# Patient Record
Sex: Female | Born: 1965
Health system: Southern US, Community
[De-identification: ages and names within clinical notes are randomized; demographics above are authoritative.]

## PROBLEM LIST (undated history)

## (undated) DIAGNOSIS — K219 Gastro-esophageal reflux disease without esophagitis: Secondary | ICD-10-CM

## (undated) DIAGNOSIS — C4491 Basal cell carcinoma of skin, unspecified: Secondary | ICD-10-CM

## (undated) DIAGNOSIS — E785 Hyperlipidemia, unspecified: Secondary | ICD-10-CM

## (undated) DIAGNOSIS — K449 Diaphragmatic hernia without obstruction or gangrene: Secondary | ICD-10-CM

## (undated) DIAGNOSIS — D649 Anemia, unspecified: Secondary | ICD-10-CM

## (undated) DIAGNOSIS — J302 Other seasonal allergic rhinitis: Secondary | ICD-10-CM

## (undated) DIAGNOSIS — M8580 Other specified disorders of bone density and structure, unspecified site: Secondary | ICD-10-CM

## (undated) DIAGNOSIS — I341 Nonrheumatic mitral (valve) prolapse: Secondary | ICD-10-CM

## (undated) DIAGNOSIS — Z8619 Personal history of other infectious and parasitic diseases: Secondary | ICD-10-CM

## (undated) DIAGNOSIS — D229 Melanocytic nevi, unspecified: Secondary | ICD-10-CM

## (undated) DIAGNOSIS — I10 Essential (primary) hypertension: Secondary | ICD-10-CM

## (undated) HISTORY — DX: Personal history of other infectious and parasitic diseases: Z86.19

## (undated) HISTORY — DX: Essential (primary) hypertension: I10

## (undated) HISTORY — PX: WISDOM TOOTH EXTRACTION: SHX21

## (undated) HISTORY — PX: UPPER GI ENDOSCOPY: SHX6162

## (undated) HISTORY — DX: Melanocytic nevi, unspecified: D22.9

## (undated) HISTORY — DX: Hyperlipidemia, unspecified: E78.5

## (undated) HISTORY — DX: Other specified disorders of bone density and structure, unspecified site: M85.80

## (undated) HISTORY — DX: Nonrheumatic mitral (valve) prolapse: I34.1

## (undated) HISTORY — DX: Other seasonal allergic rhinitis: J30.2

## (undated) HISTORY — DX: Basal cell carcinoma of skin, unspecified: C44.91

## (undated) HISTORY — DX: Diaphragmatic hernia without obstruction or gangrene: K44.9

## (undated) HISTORY — DX: Anemia, unspecified: D64.9

## (undated) HISTORY — DX: Gastro-esophageal reflux disease without esophagitis: K21.9

---

## 1998-07-27 ENCOUNTER — Other Ambulatory Visit: Admission: RE | Admit: 1998-07-27 | Discharge: 1998-07-27 | Payer: Self-pay | Admitting: *Deleted

## 1999-08-21 ENCOUNTER — Other Ambulatory Visit: Admission: RE | Admit: 1999-08-21 | Discharge: 1999-08-21 | Payer: Self-pay | Admitting: *Deleted

## 2000-09-10 ENCOUNTER — Other Ambulatory Visit: Admission: RE | Admit: 2000-09-10 | Discharge: 2000-09-10 | Payer: Self-pay | Admitting: *Deleted

## 2001-09-22 ENCOUNTER — Other Ambulatory Visit: Admission: RE | Admit: 2001-09-22 | Discharge: 2001-09-22 | Payer: Self-pay | Admitting: *Deleted

## 2002-11-04 ENCOUNTER — Other Ambulatory Visit: Admission: RE | Admit: 2002-11-04 | Discharge: 2002-11-04 | Payer: Self-pay | Admitting: Obstetrics and Gynecology

## 2003-11-15 ENCOUNTER — Other Ambulatory Visit: Admission: RE | Admit: 2003-11-15 | Discharge: 2003-11-15 | Payer: Self-pay | Admitting: Obstetrics and Gynecology

## 2004-12-07 ENCOUNTER — Other Ambulatory Visit: Admission: RE | Admit: 2004-12-07 | Discharge: 2004-12-07 | Payer: Self-pay | Admitting: Obstetrics and Gynecology

## 2005-06-24 DIAGNOSIS — C4491 Basal cell carcinoma of skin, unspecified: Secondary | ICD-10-CM

## 2005-06-24 DIAGNOSIS — D229 Melanocytic nevi, unspecified: Secondary | ICD-10-CM

## 2005-06-24 HISTORY — DX: Basal cell carcinoma of skin, unspecified: C44.91

## 2005-06-24 HISTORY — DX: Melanocytic nevi, unspecified: D22.9

## 2006-01-27 ENCOUNTER — Other Ambulatory Visit: Admission: RE | Admit: 2006-01-27 | Discharge: 2006-01-27 | Payer: Self-pay | Admitting: Obstetrics and Gynecology

## 2009-05-02 ENCOUNTER — Ambulatory Visit: Payer: Self-pay | Admitting: Family Medicine

## 2009-05-02 DIAGNOSIS — M2550 Pain in unspecified joint: Secondary | ICD-10-CM | POA: Insufficient documentation

## 2009-05-02 DIAGNOSIS — K219 Gastro-esophageal reflux disease without esophagitis: Secondary | ICD-10-CM | POA: Insufficient documentation

## 2009-05-02 DIAGNOSIS — I059 Rheumatic mitral valve disease, unspecified: Secondary | ICD-10-CM | POA: Insufficient documentation

## 2009-05-02 DIAGNOSIS — J309 Allergic rhinitis, unspecified: Secondary | ICD-10-CM | POA: Insufficient documentation

## 2009-05-02 LAB — CONVERTED CEMR LAB
BUN: 7 mg/dL (ref 6–23)
Cholesterol: 194 mg/dL (ref 0–200)
Creatinine, Ser: 0.9 mg/dL (ref 0.4–1.2)
GFR calc non Af Amer: 72.91 mL/min (ref >60)
HDL: 40.7 mg/dL (ref >39.00)
LDL Cholesterol: 132 mg/dL — ABNORMAL HIGH (ref 0–99)
Potassium: 3.6 meq/L (ref 3.5–5.1)
Total Bilirubin: 0.8 mg/dL (ref 0.3–1.2)
Triglycerides: 107 mg/dL (ref 0.0–149.0)
VLDL: 21.4 mg/dL (ref 0.0–40.0)

## 2012-12-02 DIAGNOSIS — K449 Diaphragmatic hernia without obstruction or gangrene: Secondary | ICD-10-CM

## 2012-12-02 HISTORY — DX: Diaphragmatic hernia without obstruction or gangrene: K44.9

## 2012-12-02 HISTORY — PX: COLONOSCOPY: SHX174

## 2013-04-30 ENCOUNTER — Ambulatory Visit (INDEPENDENT_AMBULATORY_CARE_PROVIDER_SITE_OTHER): Payer: 59 | Admitting: Family Medicine

## 2013-04-30 ENCOUNTER — Encounter: Payer: Self-pay | Admitting: Family Medicine

## 2013-04-30 VITALS — BP 120/60 | HR 85 | Temp 98.3°F | Ht 66.0 in | Wt 142.5 lb

## 2013-04-30 DIAGNOSIS — D649 Anemia, unspecified: Secondary | ICD-10-CM

## 2013-04-30 DIAGNOSIS — Z13220 Encounter for screening for lipoid disorders: Secondary | ICD-10-CM

## 2013-04-30 DIAGNOSIS — R5381 Other malaise: Secondary | ICD-10-CM

## 2013-04-30 DIAGNOSIS — K219 Gastro-esophageal reflux disease without esophagitis: Secondary | ICD-10-CM

## 2013-04-30 DIAGNOSIS — R5383 Other fatigue: Secondary | ICD-10-CM

## 2013-04-30 DIAGNOSIS — D509 Iron deficiency anemia, unspecified: Secondary | ICD-10-CM | POA: Insufficient documentation

## 2013-04-30 LAB — COMPREHENSIVE METABOLIC PANEL
Albumin: 3.9 g/dL (ref 3.5–5.2)
CO2: 25 mEq/L (ref 19–32)
GFR: 71.58 mL/min (ref >60.00)
Glucose, Bld: 86 mg/dL (ref 70–99)
Potassium: 4 mEq/L (ref 3.5–5.1)
Sodium: 139 mEq/L (ref 135–145)
Total Protein: 7.5 g/dL (ref 6.0–8.3)

## 2013-04-30 LAB — TSH: TSH: 0.98 u[IU]/mL (ref 0.35–5.50)

## 2013-04-30 LAB — IBC PANEL
Iron: 45 ug/dL (ref 42–145)
Transferrin: 296.1 mg/dL (ref 212.0–360.0)

## 2013-04-30 LAB — CBC WITH DIFFERENTIAL/PLATELET
Basophils Absolute: 0 10*3/uL (ref 0.0–0.1)
Eosinophils Relative: 1.1 % (ref 0.0–5.0)
MCV: 82.9 fl (ref 78.0–100.0)
Monocytes Absolute: 0.5 10*3/uL (ref 0.1–1.0)
Neutrophils Relative %: 57.1 % (ref 43.0–77.0)
Platelets: 259 10*3/uL (ref 150.0–400.0)
WBC: 7 10*3/uL (ref 4.5–10.5)

## 2013-04-30 NOTE — Assessment & Plan Note (Signed)
Stable on nexium

## 2013-04-30 NOTE — Patient Instructions (Addendum)
Work on regular exercise and healthy eating.

## 2013-04-30 NOTE — Assessment & Plan Note (Signed)
Eval thyroid and  B12. MAy be due to anemia

## 2013-04-30 NOTE — Assessment & Plan Note (Signed)
Will eval with labs. Likely due to menses, although not very heavy.  She ia having colonoscopy given fam hx of colon cancer.

## 2013-04-30 NOTE — Progress Notes (Signed)
  Subjective:    Patient ID: Rachel Bird, female    DOB: 13-Feb-1966, 47 y.o.   MRN: [id number]  HPI  47 year old female presents to re-establish and to discuss anemia.  Sees GYN ( Dr. Vincente Poli) for CPX... Saw earlier this month. Nml pap, nml mammo.  She does not have  Chol labs done there... She was tolsd at that OV that her iron was lower than in past. Menstruating. Has history of anemia in past.  She has had some fatigue.. But also stays very busy. Some stress.  GERD, on Nexium... Going to have colonoscopy and endoscopy schedule next week.  Family history.. Paternal grandmothe and aunt with colon cancer and endometrial cancer.  Father with hx of polyps.        Review of Systems  Constitutional: Positive for fatigue. Negative for fever and unexpected weight change.  HENT: Negative for ear pain, congestion, sore throat, sneezing, trouble swallowing and sinus pressure.   Eyes: Negative for pain and itching.  Respiratory: Negative for cough, shortness of breath and wheezing.   Cardiovascular: Negative for chest pain, palpitations and leg swelling.  Gastrointestinal: Negative for nausea, abdominal pain, diarrhea, constipation and blood in stool.  Genitourinary: Negative for dysuria, hematuria, vaginal discharge, difficulty urinating and menstrual problem.  Skin: Negative for rash.  Neurological: Negative for syncope, weakness, light-headedness, numbness and headaches.  Psychiatric/Behavioral: Negative for confusion and dysphoric mood. The patient is not nervous/anxious.        Objective:   Physical Exam  Constitutional: She is oriented to person, place, and time. Vital signs are normal. She appears well-developed and well-nourished. She is cooperative.  Non-toxic appearance. She does not appear ill. No distress.  nml capillary refill, nml conjunctiva.. Not pale  HENT:  Head: Normocephalic.  Right Ear: Hearing, tympanic membrane, external ear and ear canal normal. Tympanic  membrane is not erythematous, not retracted and not bulging.  Left Ear: Hearing, tympanic membrane, external ear and ear canal normal. Tympanic membrane is not erythematous, not retracted and not bulging.  Nose: No mucosal edema or rhinorrhea. Right sinus exhibits no maxillary sinus tenderness and no frontal sinus tenderness. Left sinus exhibits no maxillary sinus tenderness and no frontal sinus tenderness.  Mouth/Throat: Uvula is midline, oropharynx is clear and moist and mucous membranes are normal.  Eyes: Conjunctivae, EOM and lids are normal. Pupils are equal, round, and reactive to light. No foreign bodies found.  Neck: Trachea normal and normal range of motion. Neck supple. Carotid bruit is not present. No mass and no thyromegaly present.  Cardiovascular: Normal rate, regular rhythm, S1 normal, S2 normal, normal heart sounds, intact distal pulses and normal pulses.  Exam reveals no gallop and no friction rub.   No murmur heard. Pulmonary/Chest: Effort normal and breath sounds normal. Not tachypneic. No respiratory distress. She has no decreased breath sounds. She has no wheezes. She has no rhonchi. She has no rales.  Abdominal: Soft. Normal appearance and bowel sounds are normal. There is no tenderness.  Neurological: She is alert and oriented to person, place, and time.  Skin: Skin is warm, dry and intact. No rash noted.  Psychiatric: She has a normal mood and affect. Her speech is normal and behavior is normal. Judgment and thought content normal. Her mood appears not anxious. Cognition and memory are normal. She does not exhibit a depressed mood.          Assessment & Plan:

## 2013-05-07 ENCOUNTER — Encounter: Payer: Self-pay | Admitting: Family Medicine

## 2013-05-07 ENCOUNTER — Ambulatory Visit (INDEPENDENT_AMBULATORY_CARE_PROVIDER_SITE_OTHER): Payer: 59 | Admitting: Family Medicine

## 2013-05-07 VITALS — BP 110/60 | HR 86 | Temp 98.6°F | Ht 66.0 in | Wt 142.0 lb

## 2013-05-07 DIAGNOSIS — R0789 Other chest pain: Secondary | ICD-10-CM

## 2013-05-07 NOTE — Assessment & Plan Note (Addendum)
Most likely GI in origin but pt with risk factors of age, high cholesterol.  EKG showed  NSR, no St changes, No Q. No LVH. Will refer for treadmill exercise stress test gicen risk and upcoming procedure.

## 2013-05-07 NOTE — Patient Instructions (Addendum)
Stop at front desk to set up stress test.

## 2013-05-07 NOTE — Progress Notes (Signed)
  Subjective:    Patient ID: Rachel Bird, female    DOB: October 24, 1966, 47 y.o.   MRN: [id number]  HPI 47 year old female presents with chest pain ongoing in last few months, chest tightness, lasts few hours. Has noted when she is at rest, frequently in evening. NO exertional component. No specific relation with meals. Has reflux for last 3 years.. She thought it was due to this. Occ feels like a burning sensation. Occ has to yawn to get a deep breath (she has associated this with allergies)  Saw GI (Dr. Mechele Collin 's PA) yesterday. They were concerned enough to recommend evaluation.  She has history of MVP at [facility]. Last ECHO per pt 2010.  She has endoscopy schedule June 24.  No family history of CAD, CVA.  Non smoker. Recently found to have high cholesterol.   Review of Systems  Constitutional: Negative for fever and fatigue.  HENT: Negative for ear pain.   Eyes: Negative for pain.  Respiratory: Negative for chest tightness and shortness of breath.   Cardiovascular: Negative for chest pain, palpitations and leg swelling.  Gastrointestinal: Negative for abdominal pain.  Genitourinary: Negative for dysuria.       Objective:   Physical Exam  Constitutional: Vital signs are normal. She appears well-developed and well-nourished. She is cooperative.  Non-toxic appearance. She does not appear ill. No distress.  HENT:  Head: Normocephalic.  Right Ear: Hearing, tympanic membrane, external ear and ear canal normal. Tympanic membrane is not erythematous, not retracted and not bulging.  Left Ear: Hearing, tympanic membrane, external ear and ear canal normal. Tympanic membrane is not erythematous, not retracted and not bulging.  Nose: No mucosal edema or rhinorrhea. Right sinus exhibits no maxillary sinus tenderness and no frontal sinus tenderness. Left sinus exhibits no maxillary sinus tenderness and no frontal sinus tenderness.  Mouth/Throat: Uvula is midline, oropharynx  is clear and moist and mucous membranes are normal.  Eyes: Conjunctivae, EOM and lids are normal. Pupils are equal, round, and reactive to light. No foreign bodies found.  Neck: Trachea normal and normal range of motion. Neck supple. Carotid bruit is not present. No mass and no thyromegaly present.  Cardiovascular: Normal rate, regular rhythm, S1 normal, S2 normal, normal heart sounds, intact distal pulses and normal pulses.  Exam reveals no gallop and no friction rub.   No murmur heard. Pulmonary/Chest: Effort normal and breath sounds normal. Not tachypneic. No respiratory distress. She has no decreased breath sounds. She has no wheezes. She has no rhonchi. She has no rales. She exhibits no tenderness.  Abdominal: Soft. Normal appearance and bowel sounds are normal. There is no tenderness.  Neurological: She is alert.  Skin: Skin is warm, dry and intact. No rash noted.  Psychiatric: Her speech is normal and behavior is normal. Judgment and thought content normal. Her mood appears not anxious. Cognition and memory are normal. She does not exhibit a depressed mood.   No murmer heard despite mitral valve prolapse.        Assessment & Plan:

## 2013-05-14 ENCOUNTER — Telehealth: Payer: Self-pay

## 2013-05-14 NOTE — Telephone Encounter (Signed)
Rachel Bird with Dr Molly Maduro Elliott's office left v/m pt is scheduled for colonoscopy on 05/25/13 and needs medical clearance for procedure.Please advise.

## 2013-05-17 NOTE — Telephone Encounter (Signed)
Pt has pending exercise tolerance test... We will provide clearance when this is completed. Let Dr. Shelah Lewandowsky office know.

## 2013-05-18 ENCOUNTER — Ambulatory Visit (INDEPENDENT_AMBULATORY_CARE_PROVIDER_SITE_OTHER): Payer: 59 | Admitting: Cardiovascular Disease

## 2013-05-18 ENCOUNTER — Encounter: Payer: Self-pay | Admitting: Cardiovascular Disease

## 2013-05-18 DIAGNOSIS — R0789 Other chest pain: Secondary | ICD-10-CM

## 2013-05-18 NOTE — Procedures (Signed)
Exercise Treadmill Test  Treadmill ordered for recent epsiodes of chest pain, preprocedure test.  Resting EKG shows sinus tachycardia with rate 111 bpm, no significant ST or T wave changes Resting blood pressure of 118/82. Stand bruce protocal was used.  Patient exercised for 7 min 48 sec,  Peak heart rate of 176 bpm.  This was 101% of the maximum predicted heart rate (target heart rate 174). Achieved 10.1 METS No symptoms of chest pain or lightheadedness were reported at peak stress or in recovery.  Peak Blood pressure recorded was 142/82 Heart rate at 3 minutes in recovery was 143 bpm. No significant ST changes concerning for ischemia.  she had minimal ST depression which was upsloping.   FINAL IMPRESSION: Normal exercise stress test. No significant EKG changes concerning for ischemia. Excellent exercise tolerance. Of note she had elevated resting heart rate with significant increase with minimal exertion.  We have suggested that she closely monitor her heart rate at home. If resting heart rate continues to be elevated in the 90 to 100 range on routine basis at rest, could consider Holter monitor.

## 2013-05-18 NOTE — Telephone Encounter (Signed)
Dr. Shelah Lewandowsky office advised

## 2013-05-18 NOTE — Patient Instructions (Addendum)
No changes were made today.  Would like you to monitor your Heart rate when working out for HR below <150. You have been cleared to have your procedure.

## 2013-06-23 ENCOUNTER — Encounter: Payer: Self-pay | Admitting: Family Medicine

## 2013-06-23 ENCOUNTER — Encounter: Payer: Self-pay | Admitting: Unknown Physician Specialty

## 2013-08-26 ENCOUNTER — Encounter: Payer: Self-pay | Admitting: Family Medicine

## 2013-08-26 ENCOUNTER — Ambulatory Visit (INDEPENDENT_AMBULATORY_CARE_PROVIDER_SITE_OTHER): Payer: 59 | Admitting: Family Medicine

## 2013-08-26 VITALS — BP 124/86 | HR 84 | Temp 98.8°F | Ht 64.0 in | Wt 140.8 lb

## 2013-08-26 DIAGNOSIS — R5381 Other malaise: Secondary | ICD-10-CM

## 2013-08-26 DIAGNOSIS — E538 Deficiency of other specified B group vitamins: Secondary | ICD-10-CM | POA: Insufficient documentation

## 2013-08-26 DIAGNOSIS — D509 Iron deficiency anemia, unspecified: Secondary | ICD-10-CM

## 2013-08-26 DIAGNOSIS — K219 Gastro-esophageal reflux disease without esophagitis: Secondary | ICD-10-CM

## 2013-08-26 DIAGNOSIS — R5383 Other fatigue: Secondary | ICD-10-CM

## 2013-08-26 NOTE — Progress Notes (Signed)
  Subjective:    Patient ID: Rachel Bird, female    DOB: Oct 17, 1966, 47 y.o.   MRN: [id number]  HPI  47 year old female presents for follow up multiple issues.  Had endoscopy and colonocsopy  06/2013 mild gstritis..  Improved chest pain with spacing prilosec.  She did have a nml stress test... She does has elevated pulses at times (last OV 99, at stress test 114) At home running 84-100, no lightheadedness. No skipped beats sensation. Father with SVT. She has been tring to decrease caffeine... She drinks 2-3 caffeinated beverages a day.   She had low B12 last OV... Feels much better with thinking and energy on B12 1000 mcg day. Also found low iron and she has been taking low dose iron. She has been eating healthier.    Review of Systems  Constitutional: Negative for fever and fatigue.  HENT: Negative for ear pain.   Eyes: Negative for pain.  Respiratory: Negative for chest tightness and shortness of breath.   Cardiovascular: Negative for chest pain, palpitations and leg swelling.  Gastrointestinal: Negative for abdominal pain.  Genitourinary: Negative for dysuria.       Objective:   Physical Exam  Constitutional: Vital signs are normal. She appears well-developed and well-nourished. She is cooperative.  Non-toxic appearance. She does not appear ill. No distress.  HENT:  Head: Normocephalic.  Right Ear: Hearing, tympanic membrane, external ear and ear canal normal. Tympanic membrane is not erythematous, not retracted and not bulging.  Left Ear: Hearing, tympanic membrane, external ear and ear canal normal. Tympanic membrane is not erythematous, not retracted and not bulging.  Nose: No mucosal edema or rhinorrhea. Right sinus exhibits no maxillary sinus tenderness and no frontal sinus tenderness. Left sinus exhibits no maxillary sinus tenderness and no frontal sinus tenderness.  Mouth/Throat: Uvula is midline, oropharynx is clear and moist and mucous membranes are normal.   Eyes: Conjunctivae, EOM and lids are normal. Pupils are equal, round, and reactive to light. Lids are everted and swept, no foreign bodies found.  Neck: Trachea normal and normal range of motion. Neck supple. Carotid bruit is not present. No mass and no thyromegaly present.  Cardiovascular: Normal rate, regular rhythm, S1 normal, S2 normal, normal heart sounds, intact distal pulses and normal pulses.  Exam reveals no gallop and no friction rub.   No murmur heard. Pulmonary/Chest: Effort normal and breath sounds normal. Not tachypneic. No respiratory distress. She has no decreased breath sounds. She has no wheezes. She has no rhonchi. She has no rales.  Abdominal: Soft. Normal appearance and bowel sounds are normal. There is no tenderness.  Neurological: She is alert.  Skin: Skin is warm, dry and intact. No rash noted.  Psychiatric: Her speech is normal and behavior is normal. Judgment and thought content normal. Her mood appears not anxious. Cognition and memory are normal. She does not exhibit a depressed mood.          Assessment & Plan:

## 2013-08-26 NOTE — Assessment & Plan Note (Signed)
Improved with treatment of B12 def.

## 2013-08-26 NOTE — Assessment & Plan Note (Signed)
Chest pain improved on split does prilosec.

## 2013-08-26 NOTE — Assessment & Plan Note (Signed)
Due for re-eval. 

## 2013-08-26 NOTE — Assessment & Plan Note (Signed)
Due for re-eval on B12.

## 2013-08-26 NOTE — Patient Instructions (Addendum)
We will call with labs results. Work on [employer], stop caffeine.  Please call me if heart rate remains elevated.

## 2013-08-27 LAB — CBC WITH DIFFERENTIAL/PLATELET
Basophils Absolute: 0 10*3/uL (ref 0.0–0.1)
Basophils Relative: 0.3 % (ref 0.0–3.0)
Eosinophils Absolute: 0.1 10*3/uL (ref 0.0–0.7)
HCT: 39.8 % (ref 36.0–46.0)
Hemoglobin: 13.2 g/dL (ref 12.0–15.0)
Lymphocytes Relative: 23.7 % (ref 12.0–46.0)
Lymphs Abs: 2.9 10*3/uL (ref 0.7–4.0)
MCHC: 33.2 g/dL (ref 30.0–36.0)
MCV: 87 fl (ref 78.0–100.0)
Neutro Abs: 8.6 10*3/uL — ABNORMAL HIGH (ref 1.4–7.7)
Platelets: 264 10*3/uL (ref 150.0–400.0)
RBC: 4.58 Mil/uL (ref 3.87–5.11)

## 2013-08-27 LAB — IBC PANEL: Transferrin: 255.2 mg/dL (ref 212.0–360.0)

## 2013-08-27 LAB — VITAMIN B12: Vitamin B-12: 717 pg/mL (ref 211–911)

## 2013-10-07 ENCOUNTER — Other Ambulatory Visit: Payer: Self-pay

## 2014-01-24 ENCOUNTER — Telehealth: Payer: Self-pay | Admitting: *Deleted

## 2014-01-24 NOTE — Telephone Encounter (Signed)
Left message for pt to call and make appt to see Dr. Rockey Situ, as we have never seen her in the office.

## 2014-01-24 NOTE — Telephone Encounter (Signed)
Patient still having rapid heart beats stay at 90 to 100. Should she wear a heart monitor. Please advise and call. Thanks

## 2014-02-01 ENCOUNTER — Ambulatory Visit: Payer: 59 | Admitting: Cardiovascular Disease

## 2014-02-02 ENCOUNTER — Ambulatory Visit (INDEPENDENT_AMBULATORY_CARE_PROVIDER_SITE_OTHER): Payer: 59 | Admitting: Cardiovascular Disease

## 2014-02-02 ENCOUNTER — Encounter: Payer: Self-pay | Admitting: Cardiovascular Disease

## 2014-02-02 VITALS — BP 132/90 | HR 104 | Ht 63.0 in | Wt 148.5 lb

## 2014-02-02 DIAGNOSIS — I059 Rheumatic mitral valve disease, unspecified: Secondary | ICD-10-CM

## 2014-02-02 DIAGNOSIS — R000 Tachycardia, unspecified: Secondary | ICD-10-CM | POA: Insufficient documentation

## 2014-02-02 DIAGNOSIS — I498 Other specified cardiac arrhythmias: Secondary | ICD-10-CM

## 2014-02-02 MED ORDER — PROPRANOLOL HCL 20 MG PO TABS: 20.0000 mg | ORAL_TABLET | Freq: Three times a day (TID) | ORAL | Status: DC | PRN

## 2014-02-02 NOTE — Progress Notes (Signed)
   Patient ID: Rachel Bird, female    DOB: 1966-08-17, 48 y.o.   MRN: [id number]  HPI Comments: 48 year old female with history of chest pain secondary to mild gastritis,  Improved  with  Prilosec, who presents for evaluation of Elevated heart rate.  She recently had a routine treadmill study. Baseline heart rate was 100, increasing to 170 range with exertion, down to 143 beats a minute after 3 minutes in recovery.  Since that time she has been closely monitoring her heart rate. She reports that it is typically in the 90 range, sometimes 100 at rest.. she does not measure her heart rate during the daytime. With minimal stress, heart rate is typically 120. In general she is relatively asymptomatic. When she exerts herself at the gym, heart rate climbs quickly and she's not able to exercise for prolonged periods of time secondary to shortness of breath. She reports that she has not been working on a regular basis until recently. She try to lose weight and get in better shape.  In general she feels well has no complaints. She works at [employer].    EKG today shows sinus tachycardia with rate 104 beats per minute, no significant ST or T wave changes.      Outpatient Encounter Prescriptions as of 02/02/2014  Medication Sig  . IRON PO Take by mouth.  Marland Kitchen omeprazole (PRILOSEC) 40 MG capsule Take 40 mg by mouth 2 (two) times daily.  . vitamin B-12 (CYANOCOBALAMIN) 1000 MCG tablet Take 1,000 mcg by mouth daily.    Review of Systems  Constitutional: Negative.   HENT: Negative.   Eyes: Negative.   Respiratory: Negative.   Cardiovascular: Positive for palpitations.  Gastrointestinal: Negative.   Endocrine: Negative.   Musculoskeletal: Negative.   Skin: Negative.   Allergic/Immunologic: Negative.   Neurological: Negative.   Hematological: Negative.   Psychiatric/Behavioral: Negative.   All other systems reviewed and are negative.    BP 132/90  Pulse 104  Ht 5\' 3"  (1.6 m)  Wt 148 lb 8  oz (67.359 kg)  BMI 26.31 kg/m2  Physical Exam  Nursing note and vitals reviewed. Constitutional: She is oriented to person, place, and time. She appears well-developed and well-nourished.  HENT:  Head: Normocephalic.  Nose: Nose normal.  Mouth/Throat: Oropharynx is clear and moist.  Eyes: Conjunctivae are normal. Pupils are equal, round, and reactive to light.  Neck: Normal range of motion. Neck supple. No JVD present.  Cardiovascular: Normal rate, regular rhythm, S1 normal, S2 normal, normal heart sounds and intact distal pulses.  Exam reveals no gallop and no friction rub.   No murmur heard. Pulmonary/Chest: Effort normal and breath sounds normal. No respiratory distress. She has no wheezes. She has no rales. She exhibits no tenderness.  Abdominal: Soft. Bowel sounds are normal. She exhibits no distension. There is no tenderness.  Musculoskeletal: Normal range of motion. She exhibits no edema and no tenderness.  Lymphadenopathy:    She has no cervical adenopathy.  Neurological: She is alert and oriented to person, place, and time. Coordination normal.  Skin: Skin is warm and dry. No rash noted. No erythema.  Psychiatric: She has a normal mood and affect. Her behavior is normal. Judgment and thought content normal.    Assessment and Plan

## 2014-02-02 NOTE — Patient Instructions (Signed)
You are doing well.  Please take propranolol 1/2 up to one pill as needed for heart rate control Call for shortness of breath, ankle swelling, weight gain (fluid)  Please call us if you have new issues that need to be addressed before your next appt.

## 2014-02-02 NOTE — Assessment & Plan Note (Signed)
We spent some time talking about her baseline heart rate. She may be symptomatic particularly during exercise in stressful situations with elevated heart rate. We have recommended she try propranolol 10-20 mg when necessary for heart rate control. Perhaps she could try this before exercising to see if this will help her exercise endurance. She will watch her heart rate as she gets in better conditioning as this may also help decrease her baseline heart rate. She is likely low risk of tachycardia mediated cardiopathy. No signs of heart failure at this time but we did discuss signs and symptoms to watch for. Other options for beta blocker use with the metoprolol on a daily basis.

## 2014-02-02 NOTE — Assessment & Plan Note (Signed)
No murmur appreciated on exam. Very low likelihood of any significant mitral valve regurgitation. No further workup needed

## 2014-02-21 ENCOUNTER — Telehealth: Payer: Self-pay | Admitting: Family Medicine

## 2014-02-21 NOTE — Telephone Encounter (Signed)
Pt is scheduled to come in for her CPE 06/02/2014, but she wasn't sure if you wanted her to come in prior to that to follow up w/her iron (last follow up apptmt was 08/26/2013).  Pt says there are no new issues.  She says she's only staying tired all the time but nothing out of the normal for her.  Do you need to see her prior to July? Please advise. Thank you.

## 2014-02-21 NOTE — Telephone Encounter (Signed)
No ned to come in priro., We will recheck at CPX.

## 2014-02-22 NOTE — Telephone Encounter (Signed)
Patient notified we will just recheck her iron at her CPX in July.

## 2014-04-20 ENCOUNTER — Other Ambulatory Visit: Payer: Self-pay | Admitting: Obstetrics and Gynecology

## 2014-04-20 DIAGNOSIS — R928 Other abnormal and inconclusive findings on diagnostic imaging of breast: Secondary | ICD-10-CM

## 2014-04-26 ENCOUNTER — Telehealth: Payer: Self-pay | Admitting: Family Medicine

## 2014-04-26 DIAGNOSIS — Z13220 Encounter for screening for lipoid disorders: Secondary | ICD-10-CM

## 2014-04-26 DIAGNOSIS — D509 Iron deficiency anemia, unspecified: Secondary | ICD-10-CM

## 2014-04-26 DIAGNOSIS — E538 Deficiency of other specified B group vitamins: Secondary | ICD-10-CM

## 2014-04-26 NOTE — Telephone Encounter (Signed)
Message copied by Jinny Sanders on Tue Apr 26, 2014 10:40 AM ------      Message from: Ellamae Sia      Created: Tue Apr 19, 2014  2:22 PM      Regarding: lab orders for Wednesday, 5.27.15       Patient is scheduled for CPX labs, please order future labs, Thanks , Terri       ------

## 2014-04-27 ENCOUNTER — Ambulatory Visit
Admission: RE | Admit: 2014-04-27 | Discharge: 2014-04-27 | Disposition: A | Discharge status: Home / Self Care | Payer: 59 | Source: Ambulatory Visit | Attending: Obstetrics and Gynecology | Admitting: Obstetrics and Gynecology

## 2014-04-27 ENCOUNTER — Other Ambulatory Visit: Payer: Self-pay | Admitting: Obstetrics and Gynecology

## 2014-04-27 ENCOUNTER — Other Ambulatory Visit (INDEPENDENT_AMBULATORY_CARE_PROVIDER_SITE_OTHER): Payer: 59

## 2014-04-27 DIAGNOSIS — R928 Other abnormal and inconclusive findings on diagnostic imaging of breast: Secondary | ICD-10-CM

## 2014-04-27 DIAGNOSIS — D509 Iron deficiency anemia, unspecified: Secondary | ICD-10-CM

## 2014-04-27 DIAGNOSIS — Z13220 Encounter for screening for lipoid disorders: Secondary | ICD-10-CM

## 2014-04-27 DIAGNOSIS — E538 Deficiency of other specified B group vitamins: Secondary | ICD-10-CM

## 2014-04-27 LAB — CBC WITH DIFFERENTIAL/PLATELET
Basophils Absolute: 0 10*3/uL (ref 0.0–0.1)
Basophils Relative: 0.4 % (ref 0.0–3.0)
Eosinophils Absolute: 0.1 10*3/uL (ref 0.0–0.7)
Eosinophils Relative: 1.4 % (ref 0.0–5.0)
HEMATOCRIT: 40.3 % (ref 36.0–46.0)
HEMOGLOBIN: 13.6 g/dL (ref 12.0–15.0)
LYMPHS ABS: 3.3 10*3/uL (ref 0.7–4.0)
Lymphocytes Relative: 38.3 % (ref 12.0–46.0)
MCHC: 33.7 g/dL (ref 30.0–36.0)
MCV: 91.7 fl (ref 78.0–100.0)
MONOS PCT: 4.9 % (ref 3.0–12.0)
Monocytes Absolute: 0.4 10*3/uL (ref 0.1–1.0)
NEUTROS ABS: 4.8 10*3/uL (ref 1.4–7.7)
Neutrophils Relative %: 55 % (ref 43.0–77.0)
Platelets: 228 10*3/uL (ref 150.0–400.0)
RBC: 4.4 Mil/uL (ref 3.87–5.11)
RDW: 13.4 % (ref 11.5–15.5)
WBC: 8.7 10*3/uL (ref 4.0–10.5)

## 2014-04-27 LAB — COMPREHENSIVE METABOLIC PANEL
ALT: 10 U/L (ref 0–35)
AST: 13 U/L (ref 0–37)
Albumin: 3.9 g/dL (ref 3.5–5.2)
Alkaline Phosphatase: 48 U/L (ref 39–117)
BILIRUBIN TOTAL: 0.4 mg/dL (ref 0.2–1.2)
BUN: 9 mg/dL (ref 6–23)
CO2: 24 meq/L (ref 19–32)
CREATININE: 0.9 mg/dL (ref 0.4–1.2)
Calcium: 9 mg/dL (ref 8.4–10.5)
Chloride: 108 mEq/L (ref 96–112)
GFR: 73.14 mL/min (ref >60.00)
Glucose, Bld: 96 mg/dL (ref 70–99)
Potassium: 3.7 mEq/L (ref 3.5–5.1)
SODIUM: 140 meq/L (ref 135–145)
TOTAL PROTEIN: 7 g/dL (ref 6.0–8.3)

## 2014-04-27 LAB — LIPID PANEL
Cholesterol: 205 mg/dL — ABNORMAL HIGH (ref 0–200)
HDL: 38.3 mg/dL — ABNORMAL LOW (ref >39.00)
LDL Cholesterol: 147 mg/dL — ABNORMAL HIGH (ref 0–99)
Total CHOL/HDL Ratio: 5
Triglycerides: 99 mg/dL (ref 0.0–149.0)
VLDL: 19.8 mg/dL (ref 0.0–40.0)

## 2014-04-27 LAB — VITAMIN B12: VITAMIN B 12: 824 pg/mL (ref 211–911)

## 2014-05-02 ENCOUNTER — Ambulatory Visit
Admission: RE | Admit: 2014-05-02 | Discharge: 2014-05-02 | Disposition: A | Discharge status: Home / Self Care | Payer: 59 | Source: Ambulatory Visit | Attending: Obstetrics and Gynecology | Admitting: Obstetrics and Gynecology

## 2014-05-02 ENCOUNTER — Other Ambulatory Visit: Payer: Self-pay | Admitting: Obstetrics and Gynecology

## 2014-05-02 ENCOUNTER — Other Ambulatory Visit: Payer: 59

## 2014-05-02 DIAGNOSIS — R928 Other abnormal and inconclusive findings on diagnostic imaging of breast: Secondary | ICD-10-CM

## 2014-05-03 ENCOUNTER — Encounter: Payer: Self-pay | Admitting: Family Medicine

## 2014-05-03 ENCOUNTER — Ambulatory Visit (INDEPENDENT_AMBULATORY_CARE_PROVIDER_SITE_OTHER): Payer: 59 | Admitting: Family Medicine

## 2014-05-03 VITALS — BP 120/80 | HR 106 | Temp 98.7°F | Ht 63.35 in | Wt 147.8 lb

## 2014-05-03 DIAGNOSIS — E538 Deficiency of other specified B group vitamins: Secondary | ICD-10-CM

## 2014-05-03 DIAGNOSIS — E7800 Pure hypercholesterolemia, unspecified: Secondary | ICD-10-CM

## 2014-05-03 DIAGNOSIS — D509 Iron deficiency anemia, unspecified: Secondary | ICD-10-CM

## 2014-05-03 NOTE — Patient Instructions (Signed)
Work on low cholesterol diet. Consider starting red yeast rice 600 mg 2 tabs twice daily. If red yeast rice is started.. Make lab appt for cholesterol recheck 3-6 month.

## 2014-05-03 NOTE — Assessment & Plan Note (Signed)
Resolved on B12

## 2014-05-03 NOTE — Progress Notes (Signed)
48 year old female presents for yearly chronic health issue check.  BP Readings from Last 3 Encounters:  05/03/14 120/80  02/02/14 132/90  08/26/13 124/86    Sees GYN ( Dr. Helane Rima) for CPX.  Nml pap,  mammo showed issue, but recent breast bx was negative.Marland Kitchen    Has history of anemia in past.  Stable cbc  Her fatigue is improved.  GERD, on Nexium.  B12 in nml range  Elevated Cholesterol:  Improved from past check but not at goal < 130. Lab Results  Component Value Date   CHOL 205* 04/27/2014   HDL 38.30* 04/27/2014   LDLCALC 147* 04/27/2014   LDLDIRECT 152.1 04/30/2013   TRIG 99.0 04/27/2014   CHOLHDL 5 04/27/2014    Using medications without problems: Muscle aches:  Diet compliance:Twice a week, walking. Exercise: moderate Other complaints:  Sinus tachy: followed by Dr. Rockey Situ. Has propranolol to use as needed.    Family history.. Paternal grandmothe and aunt with colon cancer and endometrial cancer.  Father with hx of polyps.   Review of Systems  Constitutional: negative for fatigue. Negative for fever and unexpected weight change.  HENT: Negative for ear pain, congestion, sore throat, sneezing, trouble swallowing and sinus pressure.  Eyes: Negative for pain and itching.  Respiratory: Negative for cough, shortness of breath and wheezing.  Cardiovascular: Negative for chest pain, palpitations and leg swelling.  Gastrointestinal: Negative for nausea, abdominal pain, diarrhea, constipation and blood in stool.  Genitourinary: Negative for dysuria, hematuria, vaginal discharge, difficulty urinating and menstrual problem.  Skin: Negative for rash.  Neurological: Negative for syncope, weakness, light-headedness, numbness and headaches.  Psychiatric/Behavioral: Negative for confusion and dysphoric mood. The patient is not nervous/anxious.  Objective:   Physical Exam  Constitutional: She is oriented to person, place, and time. Vital signs are normal. She appears well-developed  and well-nourished. She is cooperative. Non-toxic appearance. She does not appear ill. No distress.  HENT:  Head: Normocephalic.  Right Ear: Hearing, tympanic membrane, external ear and ear canal normal. Tympanic membrane is not erythematous, not retracted and not bulging.  Left Ear: Hearing, tympanic membrane, external ear and ear canal normal. Tympanic membrane is not erythematous, not retracted and not bulging.  Nose: No mucosal edema or rhinorrhea. Right sinus exhibits no maxillary sinus tenderness and no frontal sinus tenderness. Left sinus exhibits no maxillary sinus tenderness and no frontal sinus tenderness.  Mouth/Throat: Uvula is midline, oropharynx is clear and moist and mucous membranes are normal.  Eyes: Conjunctivae, EOM and lids are normal. Pupils are equal, round, and reactive to light. No foreign bodies found.  Neck: Trachea normal and normal range of motion. Neck supple. Carotid bruit is not present. No mass and no thyromegaly present.  Cardiovascular: Tachy, regular rhythm, S1 normal, S2 normal, normal heart sounds, intact distal pulses and normal pulses. Exam reveals no gallop and no friction rub.  No murmur heard.  Pulmonary/Chest: Effort normal and breath sounds normal. Not tachypneic. No respiratory distress. She has no decreased breath sounds. She has no wheezes. She has no rhonchi. She has no rales.  Abdominal: Soft. Normal appearance and bowel sounds are normal. There is no tenderness.  Neurological: She is alert and oriented to person, place, and time.  Skin: Skin is warm, dry and intact. No rash noted.  Psychiatric: She has a normal mood and affect. Her speech is normal and behavior is normal. Judgment and thought content normal. Her mood appears not anxious. Cognition and memory are normal. She does not  exhibit a depressed mood.   The patient's preventative maintenance and recommended screening tests for an annual wellness exam were reviewed in full today. Brought up to  date unless services declined.  Counselled on the importance of diet, exercise, and its role in overall health and mortality. The patient's FH and SH was reviewed, including their home life, tobacco status, and drug and alcohol status.

## 2014-05-03 NOTE — Progress Notes (Signed)
Pre visit review using our clinic review tool, if applicable. No additional management support is needed unless otherwise documented below in the visit note. 

## 2014-05-03 NOTE — Assessment & Plan Note (Signed)
Resolved on iron. 

## 2014-05-03 NOTE — Assessment & Plan Note (Signed)
Encouraged exercise, weight loss, healthy eating habits. Start red yeast rice.  Follow up in 3-6 months with labs.

## 2014-05-26 ENCOUNTER — Other Ambulatory Visit: Payer: 59

## 2014-06-02 ENCOUNTER — Encounter: Payer: 59 | Admitting: Family Medicine

## 2014-07-27 ENCOUNTER — Ambulatory Visit (INDEPENDENT_AMBULATORY_CARE_PROVIDER_SITE_OTHER): Payer: 59

## 2014-07-27 ENCOUNTER — Ambulatory Visit (INDEPENDENT_AMBULATORY_CARE_PROVIDER_SITE_OTHER): Payer: 59 | Admitting: Podiatry

## 2014-07-27 ENCOUNTER — Encounter: Payer: Self-pay | Admitting: Podiatry

## 2014-07-27 VITALS — BP 127/98 | HR 99 | Resp 16 | Ht 64.0 in | Wt 138.0 lb

## 2014-07-27 DIAGNOSIS — M2010 Hallux valgus (acquired), unspecified foot: Secondary | ICD-10-CM

## 2014-07-27 DIAGNOSIS — M7752 Other enthesopathy of left foot: Secondary | ICD-10-CM

## 2014-07-27 DIAGNOSIS — M21619 Bunion of unspecified foot: Secondary | ICD-10-CM

## 2014-07-27 DIAGNOSIS — M7750 Other enthesopathy of unspecified foot: Secondary | ICD-10-CM

## 2014-07-27 NOTE — Progress Notes (Signed)
   Subjective:    Patient ID: Rachel Bird, female    DOB: 1965-12-16, 48 y.o.   MRN: [id number]  HPI Comments: The left foot has a knot on it, painful when pushing on it. The left lateral side of foot, tailors bunion.  Bunion on both feet   Foot Pain      Review of Systems  All other systems reviewed and are negative.      Objective:   Physical Exam: I have reviewed her past medical history medications allergies surgeries social history and review of systems. Pulses are strongly palpable bilateral. Neurologic sensorium is intact per Semmes-Weinstein monofilament. Deep tendon reflexes are intact bilateral muscle strength is 5 over 5 dorsiflexors plantar flexors inverters everters all intrinsic musculature is intact. Orthopedic evaluation demonstrates hallux abductovalgus deformities bilateral without crepitation a full range of motion. She's mild tenderness on palpation of the first metatarsophalangeal joint bilateral. Most troublesome to her today is a painful area overlying the fifth metatarsophalangeal joint of the left foot. This reactive soft tissue hypertrophy of the fifth metatarsal head and the surrounding soft tissue or more likely bursitis. Radiographically evaluation also demonstrates mild Taylor's bunion deformity. Evaluation does confirm hallux deformities bilateral with no arthritis.        Assessment & Plan:  Assessment: Hallux valgus deformity bilateral. Taylor's bunion deformity left. Bursitis fifth metatarsophalangeal joint left.  Plan: Injected dexamethasone to the point of maximal tenderness today. I also discussed surgical options with her which she will consider in January or February for her bunion repair.

## 2014-09-18 IMAGING — MG MM DIAGNOSTIC UNILATERAL L
3 series · 3 of 3 positions shown · non-contrast
Comparison: Previous exams

CLINICAL DATA: Status post ultrasound-guided core needle biopsy of
sonographically identified mass within the upper-outer left breast.

EXAM:
DIAGNOSTIC LEFT MAMMOGRAM POST ULTRASOUND BIOPSY

[L XCCL (1 of 2)]
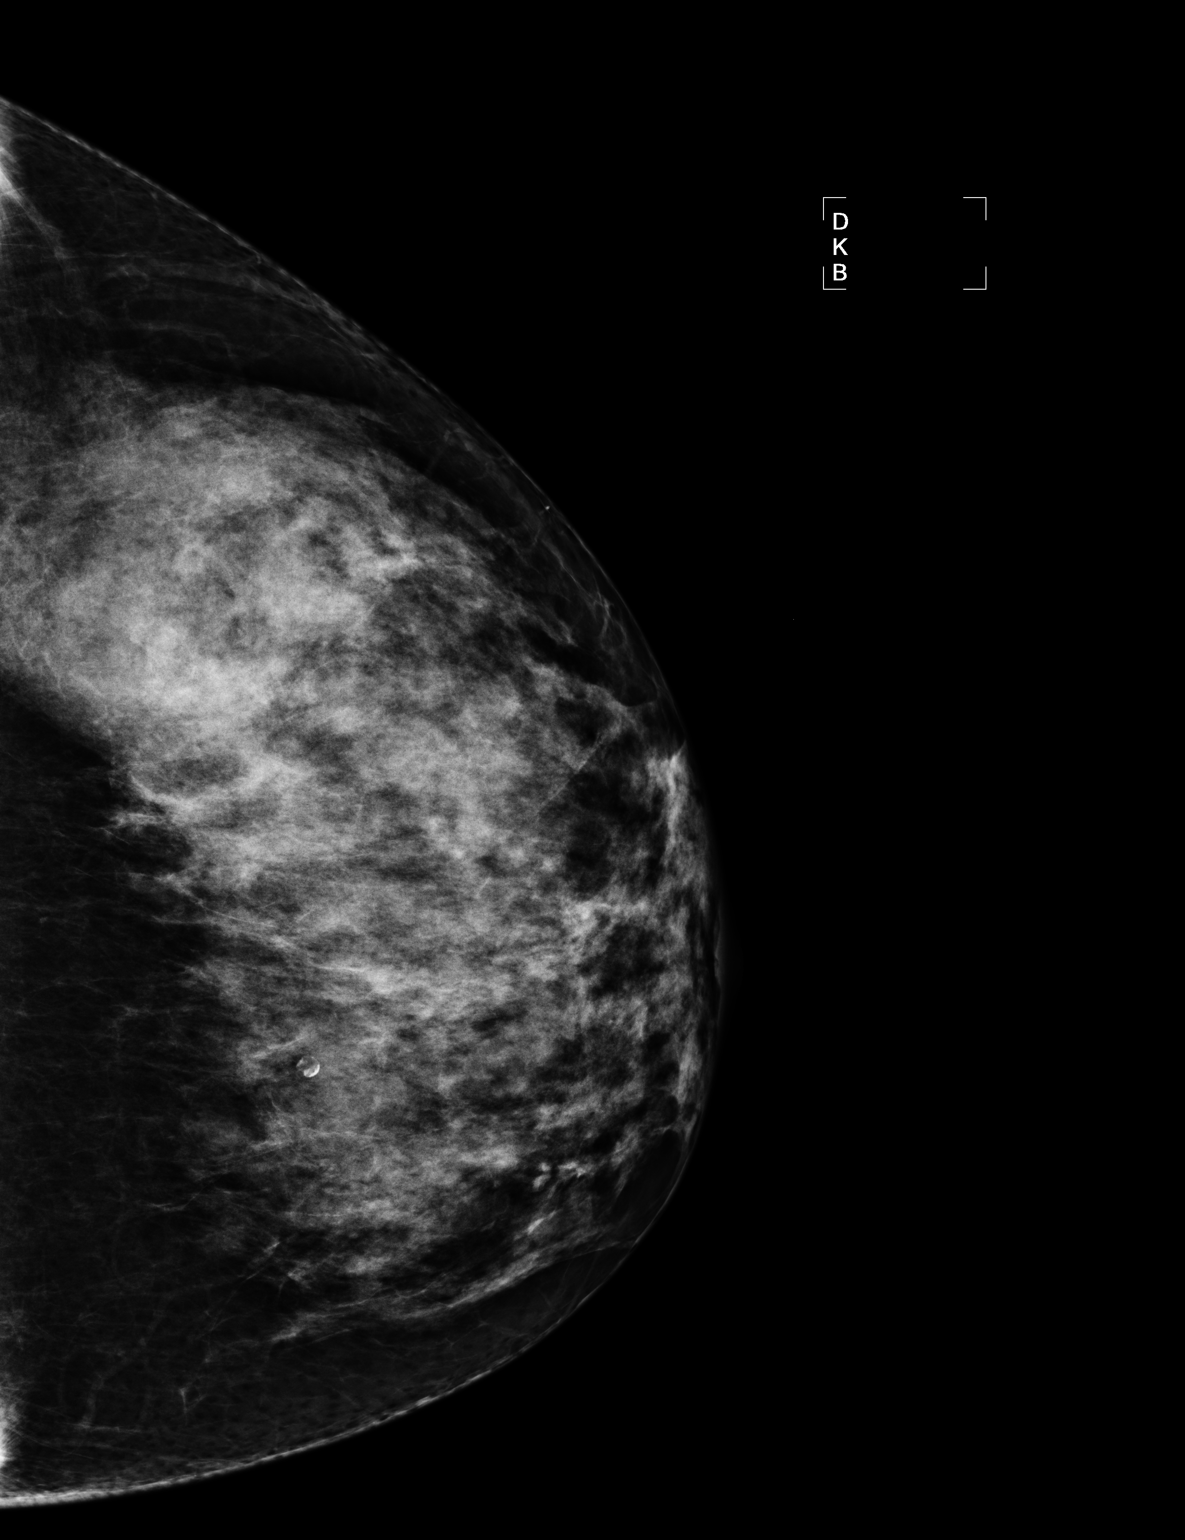

[L XCCL (2 of 2)]
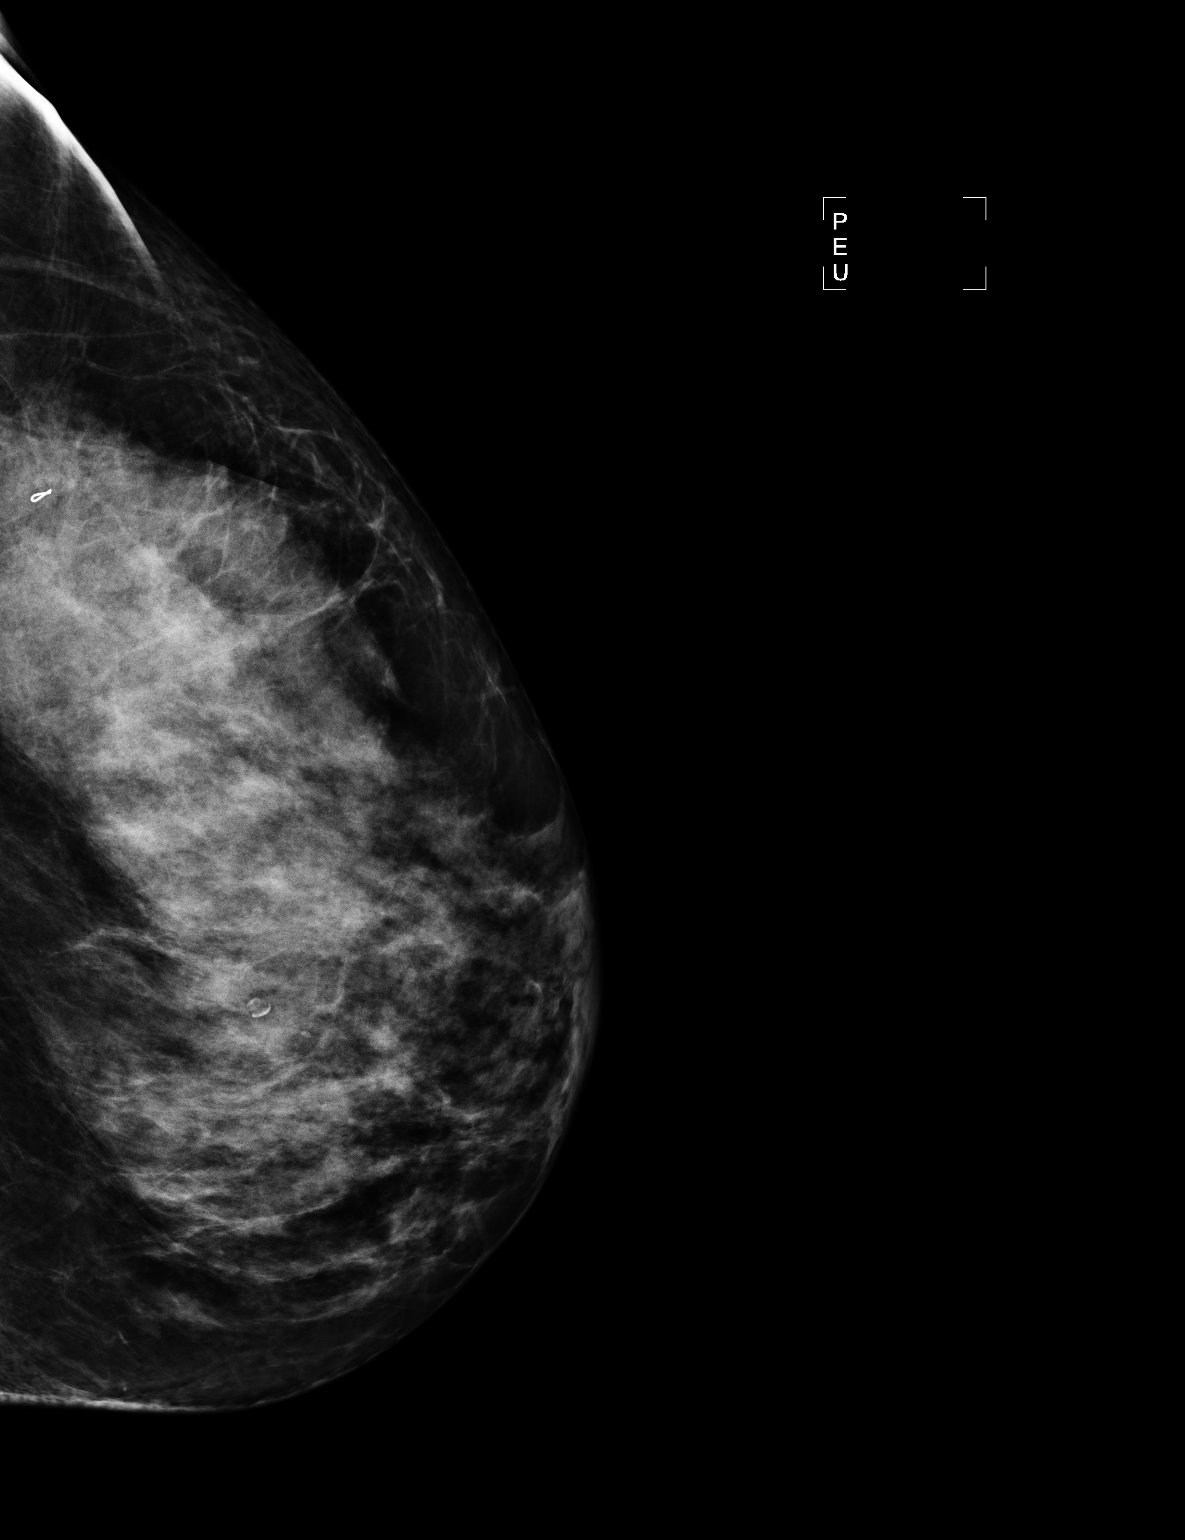

[L ML]
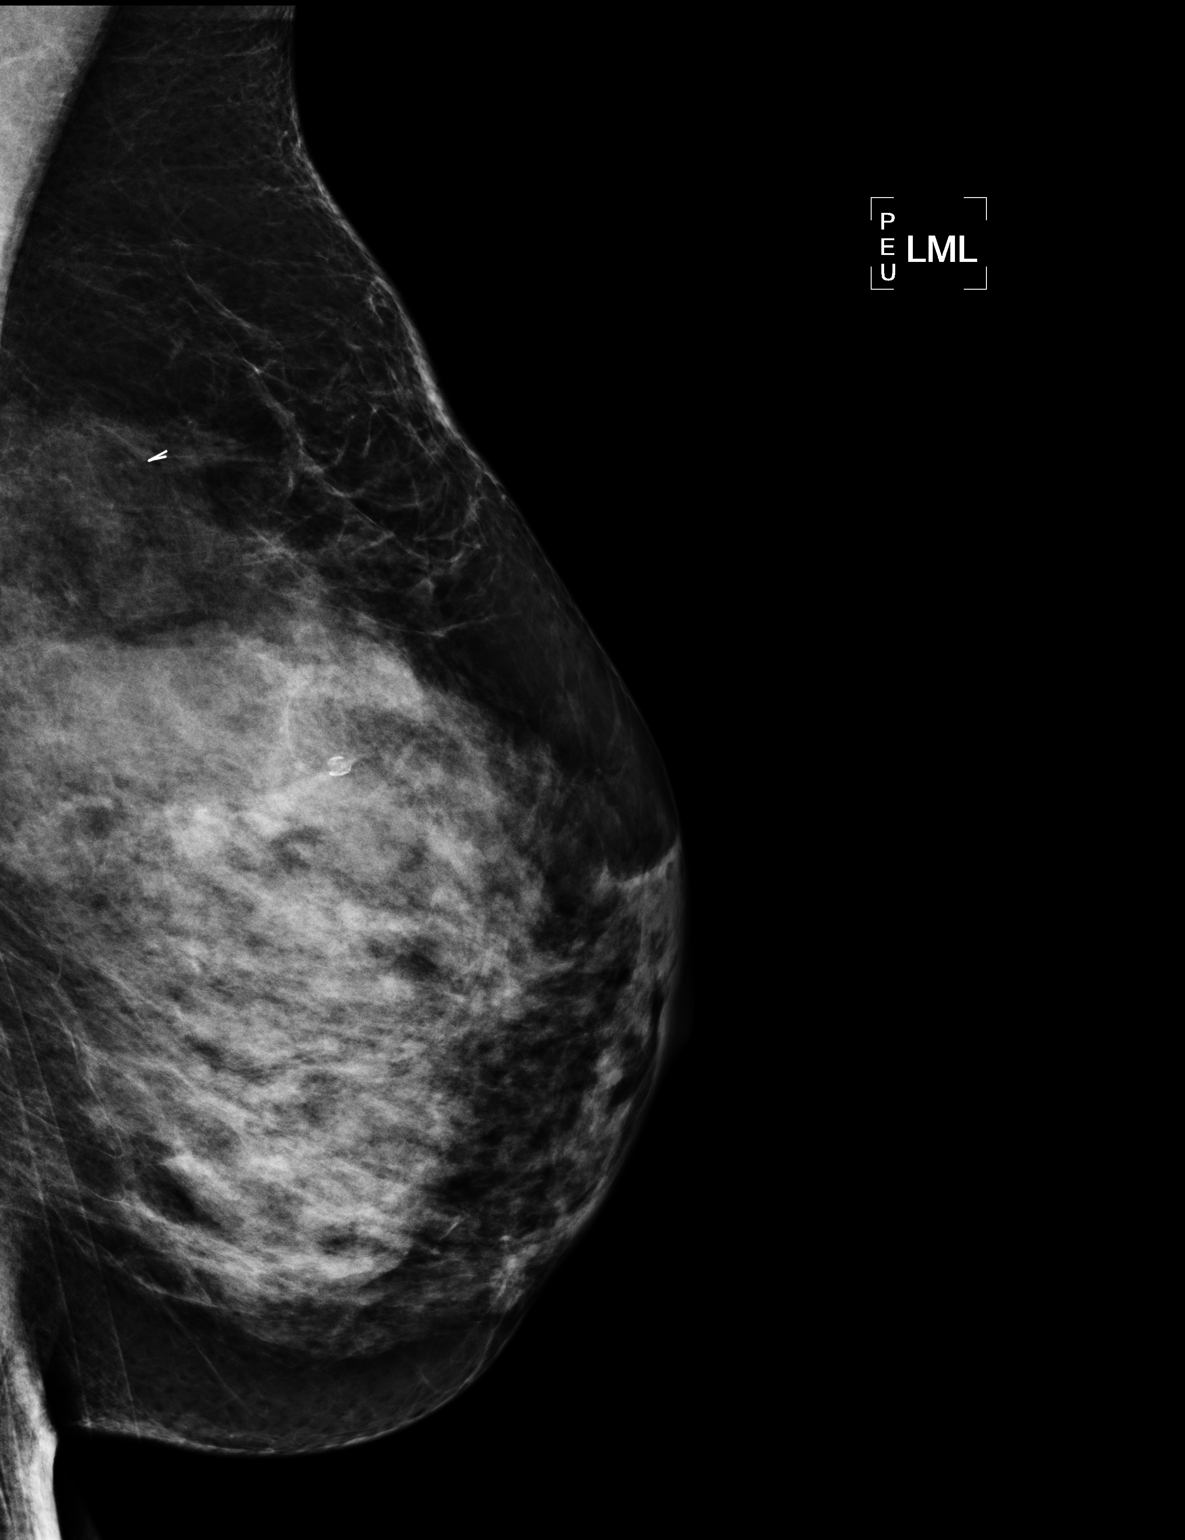

[3 of 3 positions shown; findings below may reference images not displayed]

FINDINGS: Mammographic images were obtained following ultrasound guided biopsy
of sonographically identified mass within the upper-outer left
breast. Ribbon shaped marking clip corresponds with the location of
the sonographically identified abnormality. This is slightly
anterior to the initial mammographically identified abnormality.
IMPRESSION: Appropriate position biopsy marking clip for sonographically
identified and biopsied left breast mass.

Final Assessment: Post Procedure Mammograms for Marker Placement

## 2014-09-18 IMAGING — US US BX LT
1 series · 6 of 6 positions shown · non-contrast
Comparison: Previous exams.

ADDENDUM:
Pathology revealed fibrocystic changes and pseudoangiomatous stromal
hyperplasia (PASH) in the left breast. This was found to be
concordant by Dr. Kiri Jim. Pathology was relayed to the
patient by telephone. The patient reported doing well after the
biopsy. Post biopsy instructions were reviewed and her questions
were answered. She was encouraged to call The [REDACTED] for any additional concerns. She was asked to
return in 6 months for diagnostic left mammography.

Pathology results reported by Nomasibulele Moatshe RN, BSN on May 03, 2014.
CLINICAL DATA: Patient with possible left breast mass.
EXAM:
ULTRASOUND GUIDED LEFT BREAST CORE NEEDLE BIOPSY

[Series 1: us bx left · 6 of 6 slices shown]
[im 1/6]
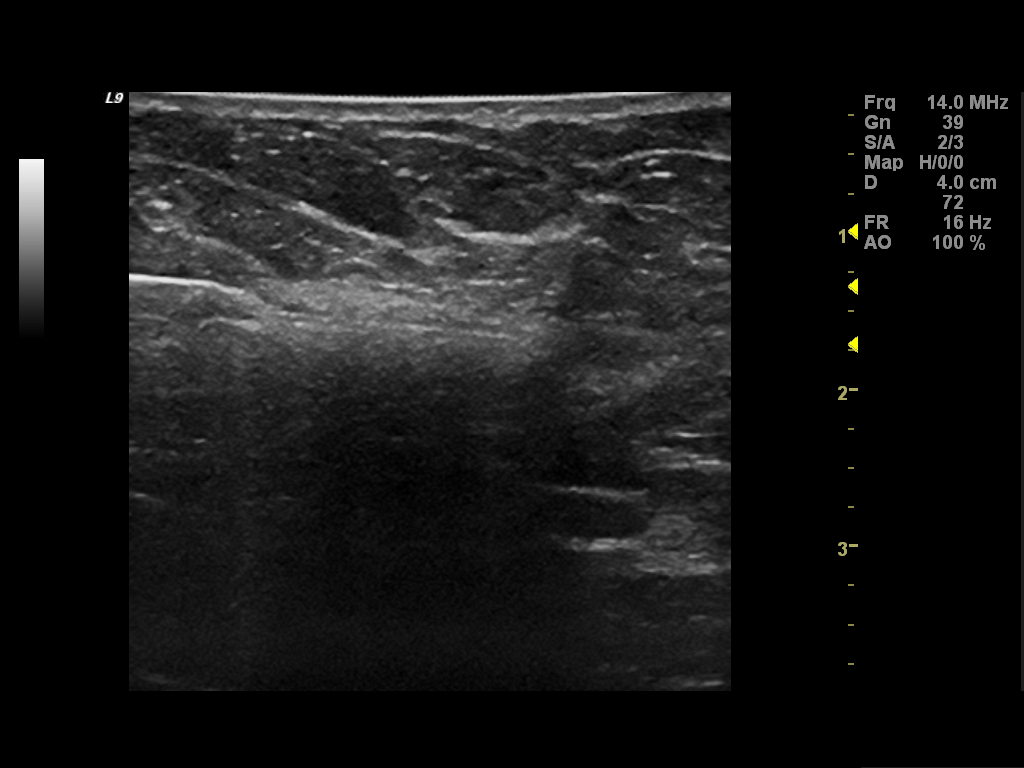
[im 2/6]
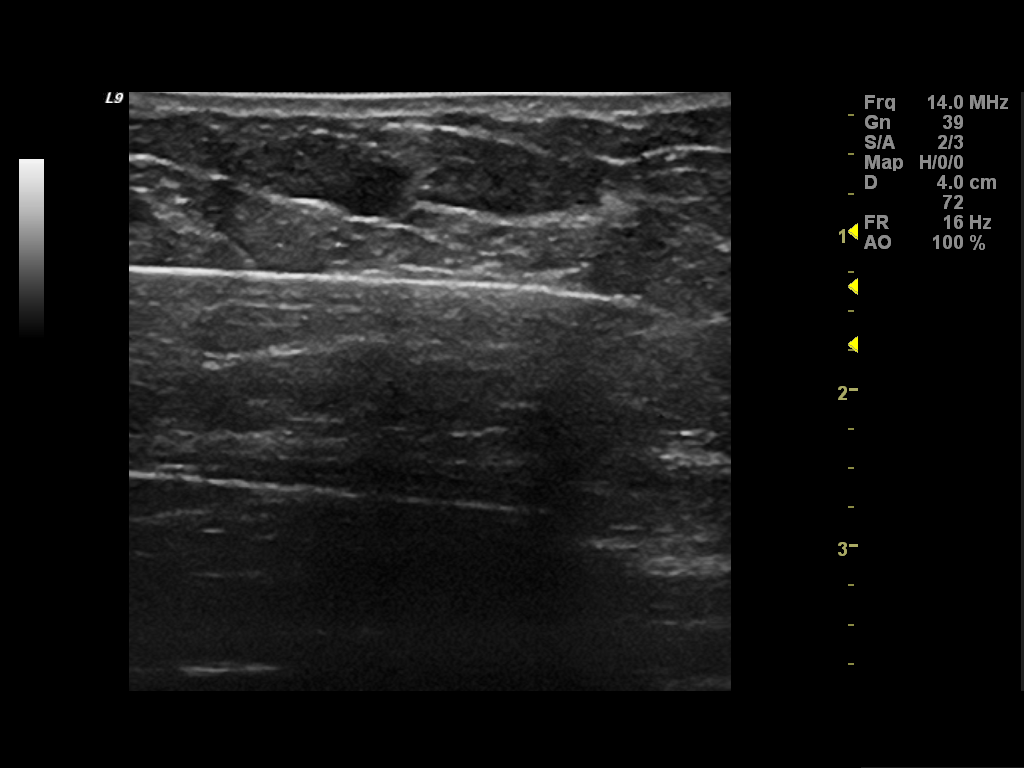
[im 3/6]
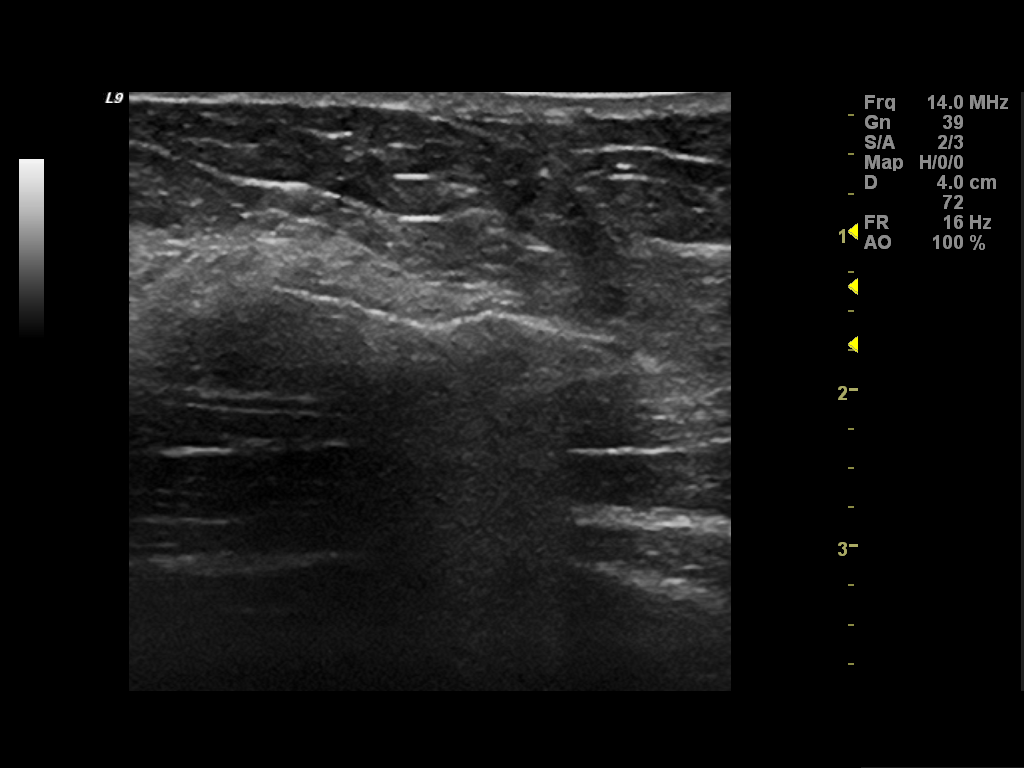
[im 4/6]
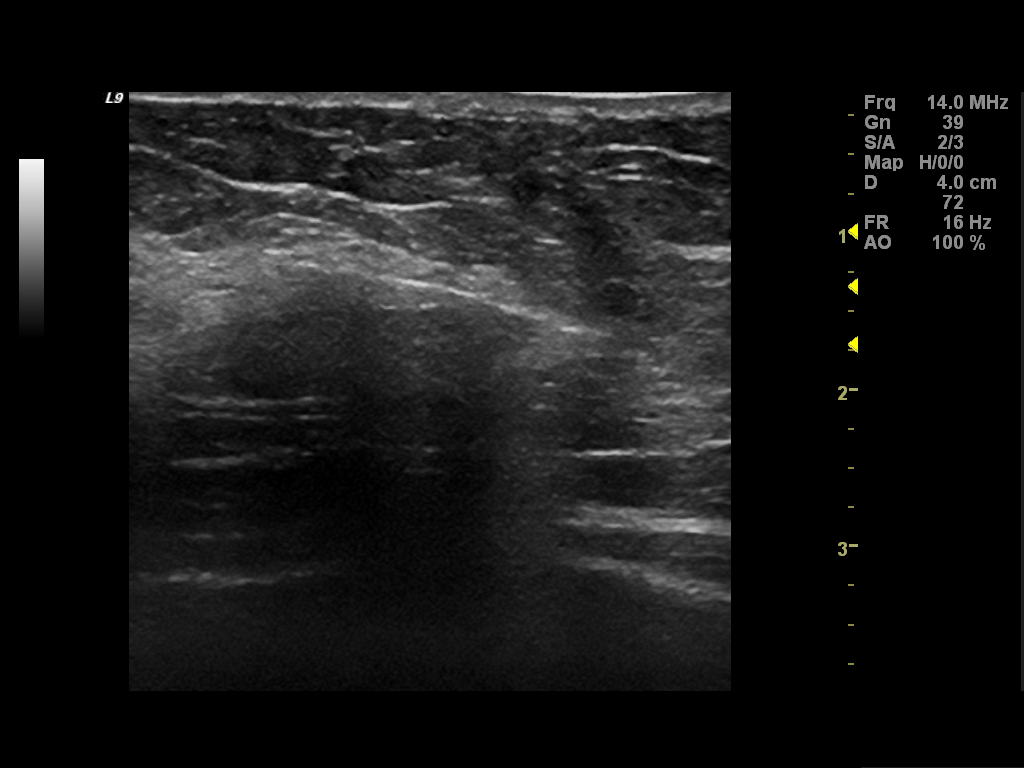
[im 5/6]
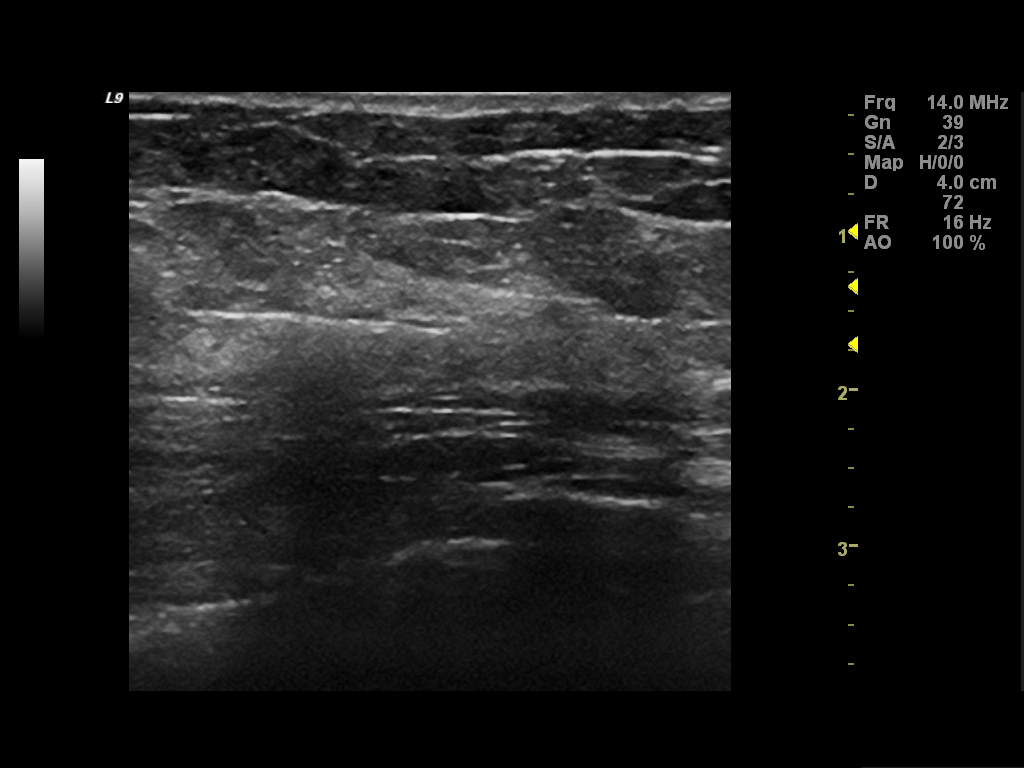
[im 6/6]
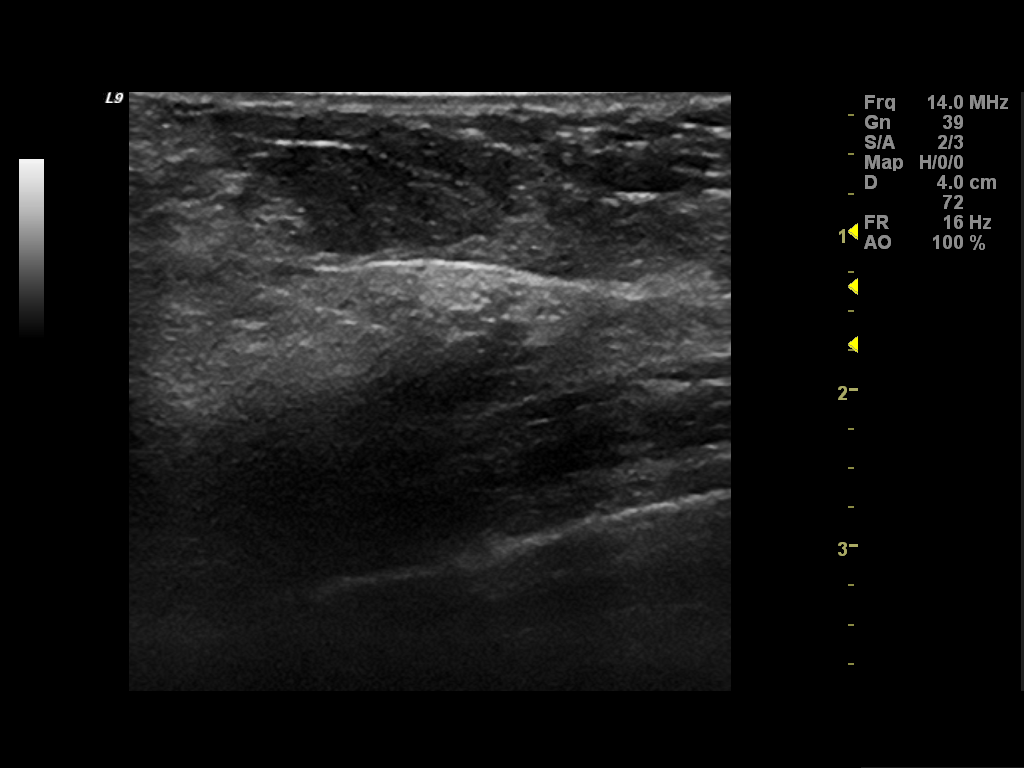

[6 of 6 positions shown; findings below may reference images not displayed]

PROCEDURE:
I met with the patient and we discussed the procedure of
ultrasound-guided biopsy, including benefits and alternatives. We
discussed the high likelihood of a successful procedure. We
discussed the risks of the procedure including infection, bleeding,
tissue injury, clip migration, and inadequate sampling. Informed
written consent was given. The usual time-out protocol was performed
immediately prior to the procedure.

Using sterile technique and 2% Lidocaine as local anesthetic, under
direct ultrasound visualization, a 12 gauge vacuum-assisteddevice
was used to perform biopsy of irregular hypoechoic mass within the
upper-outer left breast 2 o'clock position using a medial approach.
At the conclusion of the procedure, a ribbon shaped tissue marker
clip was deployed into the biopsy cavity. Follow-up 2-view mammogram
was performed and dictated separately.
IMPRESSION: Ultrasound-guided biopsy of irregular hypoechoic mass upper outer
left breast. No apparent complications.

## 2014-10-21 ENCOUNTER — Other Ambulatory Visit: Payer: Self-pay | Admitting: Obstetrics and Gynecology

## 2014-10-21 DIAGNOSIS — N63 Unspecified lump in unspecified breast: Secondary | ICD-10-CM

## 2014-11-04 ENCOUNTER — Ambulatory Visit
Admission: RE | Admit: 2014-11-04 | Discharge: 2014-11-04 | Disposition: A | Discharge status: Home / Self Care | Payer: 59 | Source: Ambulatory Visit | Attending: Obstetrics and Gynecology | Admitting: Obstetrics and Gynecology

## 2014-11-04 DIAGNOSIS — N63 Unspecified lump in unspecified breast: Secondary | ICD-10-CM

## 2015-04-21 ENCOUNTER — Other Ambulatory Visit: Payer: Self-pay | Admitting: Obstetrics and Gynecology

## 2015-04-24 LAB — CYTOLOGY - PAP

## 2015-12-11 ENCOUNTER — Telehealth: Payer: Self-pay | Admitting: Family Medicine

## 2015-12-11 ENCOUNTER — Other Ambulatory Visit: Payer: Self-pay

## 2015-12-11 DIAGNOSIS — E538 Deficiency of other specified B group vitamins: Secondary | ICD-10-CM

## 2015-12-11 DIAGNOSIS — E7800 Pure hypercholesterolemia, unspecified: Secondary | ICD-10-CM

## 2015-12-11 DIAGNOSIS — D509 Iron deficiency anemia, unspecified: Secondary | ICD-10-CM

## 2015-12-11 NOTE — Telephone Encounter (Signed)
-----   Message from Marchia Bond sent at 12/05/2015  8:51 AM EST ----- Regarding: cpx labs Mon 1/9, need orders. Thanks! :-) Please order  future cpx labs for pt's upcoming lab appt. Thanks Aniceto Boss

## 2015-12-13 ENCOUNTER — Other Ambulatory Visit: Payer: Self-pay

## 2015-12-14 ENCOUNTER — Other Ambulatory Visit (INDEPENDENT_AMBULATORY_CARE_PROVIDER_SITE_OTHER): Payer: 59

## 2015-12-14 DIAGNOSIS — D509 Iron deficiency anemia, unspecified: Secondary | ICD-10-CM

## 2015-12-14 DIAGNOSIS — E7800 Pure hypercholesterolemia, unspecified: Secondary | ICD-10-CM

## 2015-12-14 DIAGNOSIS — E538 Deficiency of other specified B group vitamins: Secondary | ICD-10-CM | POA: Diagnosis not present

## 2015-12-14 LAB — LIPID PANEL
CHOL/HDL RATIO: 4
Cholesterol: 196 mg/dL (ref 0–200)
HDL: 53.4 mg/dL (ref >39.00)
LDL Cholesterol: 121 mg/dL — ABNORMAL HIGH (ref 0–99)
NonHDL: 142.81
Triglycerides: 109 mg/dL (ref 0.0–149.0)
VLDL: 21.8 mg/dL (ref 0.0–40.0)

## 2015-12-14 LAB — COMPREHENSIVE METABOLIC PANEL
ALBUMIN: 4.4 g/dL (ref 3.5–5.2)
ALT: 8 U/L (ref 0–35)
AST: 11 U/L (ref 0–37)
Alkaline Phosphatase: 43 U/L (ref 39–117)
BUN: 14 mg/dL (ref 6–23)
CALCIUM: 9.3 mg/dL (ref 8.4–10.5)
CHLORIDE: 107 meq/L (ref 96–112)
CO2: 27 mEq/L (ref 19–32)
Creatinine, Ser: 0.93 mg/dL (ref 0.40–1.20)
GFR: 68.15 mL/min (ref >60.00)
Glucose, Bld: 87 mg/dL (ref 70–99)
POTASSIUM: 4 meq/L (ref 3.5–5.1)
Sodium: 140 mEq/L (ref 135–145)
Total Bilirubin: 0.4 mg/dL (ref 0.2–1.2)
Total Protein: 6.9 g/dL (ref 6.0–8.3)

## 2015-12-14 LAB — CBC WITH DIFFERENTIAL/PLATELET
BASOS PCT: 0.5 % (ref 0.0–3.0)
Basophils Absolute: 0 10*3/uL (ref 0.0–0.1)
EOS PCT: 1.3 % (ref 0.0–5.0)
Eosinophils Absolute: 0.1 10*3/uL (ref 0.0–0.7)
HEMATOCRIT: 42.7 % (ref 36.0–46.0)
HEMOGLOBIN: 13.9 g/dL (ref 12.0–15.0)
LYMPHS PCT: 42.3 % (ref 12.0–46.0)
Lymphs Abs: 3.1 10*3/uL (ref 0.7–4.0)
MCHC: 32.6 g/dL (ref 30.0–36.0)
MCV: 90.9 fl (ref 78.0–100.0)
MONOS PCT: 5.2 % (ref 3.0–12.0)
Monocytes Absolute: 0.4 10*3/uL (ref 0.1–1.0)
NEUTROS ABS: 3.7 10*3/uL (ref 1.4–7.7)
Neutrophils Relative %: 50.7 % (ref 43.0–77.0)
PLATELETS: 250 10*3/uL (ref 150.0–400.0)
RBC: 4.69 Mil/uL (ref 3.87–5.11)
RDW: 14 % (ref 11.5–15.5)
WBC: 7.2 10*3/uL (ref 4.0–10.5)

## 2015-12-14 LAB — VITAMIN B12: Vitamin B-12: 301 pg/mL (ref 211–911)

## 2015-12-15 ENCOUNTER — Ambulatory Visit (INDEPENDENT_AMBULATORY_CARE_PROVIDER_SITE_OTHER): Payer: 59 | Admitting: Family Medicine

## 2015-12-15 ENCOUNTER — Encounter: Payer: Self-pay | Admitting: Family Medicine

## 2015-12-15 VITALS — BP 100/60 | HR 96 | Temp 98.1°F | Ht 63.0 in | Wt 135.2 lb

## 2015-12-15 DIAGNOSIS — E7800 Pure hypercholesterolemia, unspecified: Secondary | ICD-10-CM | POA: Diagnosis not present

## 2015-12-15 DIAGNOSIS — R000 Tachycardia, unspecified: Secondary | ICD-10-CM

## 2015-12-15 DIAGNOSIS — Z23 Encounter for immunization: Secondary | ICD-10-CM

## 2015-12-15 DIAGNOSIS — E538 Deficiency of other specified B group vitamins: Secondary | ICD-10-CM | POA: Diagnosis not present

## 2015-12-15 DIAGNOSIS — K219 Gastro-esophageal reflux disease without esophagitis: Secondary | ICD-10-CM | POA: Diagnosis not present

## 2015-12-15 NOTE — Assessment & Plan Note (Signed)
Resolved

## 2015-12-15 NOTE — Progress Notes (Signed)
50 year old female presents for yearly chronic health issue check.  BP Readings from Last 3 Encounters:  12/15/15 100/60  07/27/14 127/98  05/03/14 120/80    Sees GYN ( Dr. Helane Rima) for CPX in last year. Nml pap  Has history of anemia in past. Stable cbc Her fatigue is improved.  GERD, no longer on nexium. NO symptoms since weight loss. Avoiding triggers.  B12 in nml range  Elevated Cholesterol: Improved and now at goal < 130. Lab Results  Component Value Date   CHOL 196 12/14/2015   HDL 53.40 12/14/2015   LDLCALC 121* 12/14/2015   LDLDIRECT 152.1 04/30/2013   TRIG 109.0 12/14/2015   CHOLHDL 4 12/14/2015  Using medications without problems:None Muscle aches: None Diet compliance:Healthy  Exercise: moderate 2-3 days a week. Other complaints:   She has lost 12-15 lbs i last year. Wt Readings from Last 3 Encounters:  12/15/15 135 lb 4 oz (61.349 kg)  07/27/14 138 lb (62.596 kg)  05/03/14 147 lb 12 oz (67.019 kg)    Sinus tachy: followed by Dr. Rockey Situ. Has propranolol to use as needed.   Social History /Family History/Past Medical History reviewed and updated if needed. Family history.. Paternal grandmother and aunt with colon cancer and endometrial cancer.  Father with hx of polyps.   Review of Systems  Constitutional: negative for fatigue. Negative for fever and unexpected weight change.  HENT: Negative for ear pain, congestion, sore throat, sneezing, trouble swallowing and sinus pressure.  Eyes: Negative for pain and itching.  Respiratory: Negative for cough, shortness of breath and wheezing.  Cardiovascular: Negative for chest pain, palpitations and leg swelling.  Gastrointestinal: Negative for nausea, abdominal pain, diarrhea, constipation and blood in stool.  Genitourinary: Negative for dysuria, hematuria, vaginal discharge, difficulty urinating and menstrual problem.  Skin: Negative for rash.  Neurological: Negative for syncope, weakness,  light-headedness, numbness and headaches.  Psychiatric/Behavioral: Negative for confusion and dysphoric mood. The patient is not nervous/anxious.  Objective:   Physical Exam  Constitutional: She is oriented to person, place, and time. Vital signs are normal. She appears well-developed and well-nourished. She is cooperative. Non-toxic appearance. She does not appear ill. No distress.  HENT:  Head: Normocephalic.  Right Ear: Hearing, tympanic membrane, external ear and ear canal normal. Tympanic membrane is not erythematous, not retracted and not bulging.  Left Ear: Hearing, tympanic membrane, external ear and ear canal normal. Tympanic membrane is not erythematous, not retracted and not bulging.  Nose: No mucosal edema or rhinorrhea. Right sinus exhibits no maxillary sinus tenderness and no frontal sinus tenderness. Left sinus exhibits no maxillary sinus tenderness and no frontal sinus tenderness.  Mouth/Throat: Uvula is midline, oropharynx is clear and moist and mucous membranes are normal.  Eyes: Conjunctivae, EOM and lids are normal. Pupils are equal, round, and reactive to light. No foreign bodies found.  Neck: Trachea normal and normal range of motion. Neck supple. Carotid bruit is not present. No mass and no thyromegaly present.  Cardiovascular: Tachy, regular rhythm, S1 normal, S2 normal, normal heart sounds, intact distal pulses and normal pulses. Exam reveals no gallop and no friction rub.  No murmur heard.  Pulmonary/Chest: Effort normal and breath sounds normal. Not tachypneic. No respiratory distress. She has no decreased breath sounds. She has no wheezes. She has no rhonchi. She has no rales.  Abdominal: Soft. Normal appearance and bowel sounds are normal. There is no tenderness.  Neurological: She is alert and oriented to person, place, and time.  Skin: Skin  is warm, dry and intact. No rash noted.  Psychiatric: She has a normal mood and affect. Her speech is normal and  behavior is normal. Judgment and thought content normal. Her mood appears not anxious. Cognition and memory are normal. She does not exhibit a depressed mood.   The patient's preventative maintenance and recommended screening tests for an annual wellness exam were reviewed in full today. Brought up to date unless services declined.  Counselled on the importance of diet, exercise, and its role in overall health and mortality. The patient's FH and SH was reviewed, including their home life, tobacco status, and drug and alcohol status.   Vaccines: refuses flu and due for Tdap. Agrees to td componnent  Mammo:  nml in last year  PAP/DVE: per Dr. Helane Rima  nonsmoker  No ETOH.

## 2015-12-15 NOTE — Patient Instructions (Signed)
Keep up great work with healthy lifestyle.

## 2015-12-15 NOTE — Assessment & Plan Note (Signed)
Improved control with lifestyle changes. 

## 2015-12-15 NOTE — Assessment & Plan Note (Signed)
Resolved off nexium

## 2015-12-15 NOTE — Assessment & Plan Note (Signed)
Off Nexium, able to control with weight loss and trigger avoidance.

## 2015-12-15 NOTE — Progress Notes (Signed)
Pre visit review using our clinic review tool, if applicable. No additional management support is needed unless otherwise documented below in the visit note. 

## 2015-12-15 NOTE — Addendum Note (Signed)
Addended by: Carter Kitten on: 12/15/2015 02:44 PM   Modules accepted: Orders

## 2016-10-17 ENCOUNTER — Other Ambulatory Visit: Payer: Self-pay | Admitting: Physician Assistant

## 2016-12-03 ENCOUNTER — Telehealth: Payer: Self-pay | Admitting: Family Medicine

## 2016-12-03 DIAGNOSIS — E7800 Pure hypercholesterolemia, unspecified: Secondary | ICD-10-CM

## 2016-12-03 DIAGNOSIS — D508 Other iron deficiency anemias: Secondary | ICD-10-CM

## 2016-12-03 DIAGNOSIS — E538 Deficiency of other specified B group vitamins: Secondary | ICD-10-CM

## 2016-12-03 NOTE — Telephone Encounter (Signed)
-----   Message from Ellamae Sia sent at 11/29/2016 10:30 AM EST ----- Regarding: Lab orders for Thursday, 1.11.18 Patient is scheduled for CPX labs, please order future labs, Thanks , Karna Christmas

## 2016-12-11 DIAGNOSIS — N924 Excessive bleeding in the premenopausal period: Secondary | ICD-10-CM | POA: Diagnosis not present

## 2016-12-12 ENCOUNTER — Other Ambulatory Visit (INDEPENDENT_AMBULATORY_CARE_PROVIDER_SITE_OTHER): Payer: 59

## 2016-12-12 DIAGNOSIS — E538 Deficiency of other specified B group vitamins: Secondary | ICD-10-CM | POA: Diagnosis not present

## 2016-12-12 DIAGNOSIS — E7800 Pure hypercholesterolemia, unspecified: Secondary | ICD-10-CM

## 2016-12-12 DIAGNOSIS — D508 Other iron deficiency anemias: Secondary | ICD-10-CM | POA: Diagnosis not present

## 2016-12-12 LAB — COMPREHENSIVE METABOLIC PANEL
ALT: 15 U/L (ref 0–35)
AST: 13 U/L (ref 0–37)
Albumin: 3.9 g/dL (ref 3.5–5.2)
Alkaline Phosphatase: 31 U/L — ABNORMAL LOW (ref 39–117)
BILIRUBIN TOTAL: 0.4 mg/dL (ref 0.2–1.2)
BUN: 12 mg/dL (ref 6–23)
CALCIUM: 9 mg/dL (ref 8.4–10.5)
CHLORIDE: 107 meq/L (ref 96–112)
CO2: 24 mEq/L (ref 19–32)
CREATININE: 0.93 mg/dL (ref 0.40–1.20)
GFR: 67.87 mL/min (ref >60.00)
GLUCOSE: 101 mg/dL — AB (ref 70–99)
Potassium: 3.9 mEq/L (ref 3.5–5.1)
SODIUM: 139 meq/L (ref 135–145)
Total Protein: 6.6 g/dL (ref 6.0–8.3)

## 2016-12-12 LAB — LIPID PANEL
CHOL/HDL RATIO: 5
Cholesterol: 180 mg/dL (ref 0–200)
HDL: 39.6 mg/dL (ref >39.00)
LDL Cholesterol: 122 mg/dL — ABNORMAL HIGH (ref 0–99)
NONHDL: 140.24
Triglycerides: 89 mg/dL (ref 0.0–149.0)
VLDL: 17.8 mg/dL (ref 0.0–40.0)

## 2016-12-12 LAB — CBC WITH DIFFERENTIAL/PLATELET
Basophils Absolute: 0 10*3/uL (ref 0.0–0.1)
Basophils Relative: 0.4 % (ref 0.0–3.0)
EOS ABS: 0.1 10*3/uL (ref 0.0–0.7)
Eosinophils Relative: 0.9 % (ref 0.0–5.0)
HCT: 36.8 % (ref 36.0–46.0)
Hemoglobin: 12.3 g/dL (ref 12.0–15.0)
LYMPHS ABS: 2.4 10*3/uL (ref 0.7–4.0)
Lymphocytes Relative: 28.5 % (ref 12.0–46.0)
MCHC: 33.6 g/dL (ref 30.0–36.0)
MCV: 89.4 fl (ref 78.0–100.0)
MONOS PCT: 6.2 % (ref 3.0–12.0)
Monocytes Absolute: 0.5 10*3/uL (ref 0.1–1.0)
NEUTROS PCT: 64 % (ref 43.0–77.0)
Neutro Abs: 5.5 10*3/uL (ref 1.4–7.7)
PLATELETS: 295 10*3/uL (ref 150.0–400.0)
RBC: 4.11 Mil/uL (ref 3.87–5.11)
RDW: 13.9 % (ref 11.5–15.5)
WBC: 8.6 10*3/uL (ref 4.0–10.5)

## 2016-12-12 LAB — VITAMIN B12: Vitamin B-12: 218 pg/mL (ref 211–911)

## 2016-12-17 ENCOUNTER — Encounter: Payer: 59 | Admitting: Family Medicine

## 2016-12-31 ENCOUNTER — Ambulatory Visit (INDEPENDENT_AMBULATORY_CARE_PROVIDER_SITE_OTHER): Payer: 59 | Admitting: Family Medicine

## 2016-12-31 ENCOUNTER — Encounter: Payer: Self-pay | Admitting: Family Medicine

## 2016-12-31 VITALS — BP 112/70 | HR 88 | Temp 98.6°F | Ht 63.25 in | Wt 142.5 lb

## 2016-12-31 DIAGNOSIS — E7800 Pure hypercholesterolemia, unspecified: Secondary | ICD-10-CM | POA: Diagnosis not present

## 2016-12-31 DIAGNOSIS — Z0000 Encounter for general adult medical examination without abnormal findings: Secondary | ICD-10-CM | POA: Diagnosis not present

## 2016-12-31 DIAGNOSIS — E538 Deficiency of other specified B group vitamins: Secondary | ICD-10-CM | POA: Diagnosis not present

## 2016-12-31 DIAGNOSIS — D508 Other iron deficiency anemias: Secondary | ICD-10-CM | POA: Diagnosis not present

## 2016-12-31 NOTE — Progress Notes (Signed)
Subjective:    Patient ID: Rachel Bird, female    DOB: 09/23/1966, 51 y.o.   MRN: [id number]  HPI    51 year old female presents for  annual wellness.  She sees GYN for  CPX.   Has scheduled uterine polyp to be removed by Dr. Helane Rima.  Was causing heavy bleeding. Hg  nml.  Elevated Cholesterol: AHA risk cal 1.2% risk in next 10 years for CVD. No indications for treatment.  Diet compliance: Good. Exercise:2 times a week at gym. Other complaints:  Body mass index is 25.04 kg/m. Wt Readings from Last 3 Encounters:  12/31/16 142 lb 8 oz (64.6 kg)  12/15/15 135 lb 4 oz (61.3 kg)  07/27/14 138 lb (62.6 kg)    She has been feeling more anxious lately. Daughter with OCD, anxiety, mother with GAD.  Will think about counseling.  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  No depression, no anhedonia.  Trouble falling asleep. Mind racing at times.   She has fear of flying.    Sinus tachy:  occ palpitations, not bothersome. Previously evaluated by Dr. Rockey Situ. Social History /Family History/Past Medical History reviewed and updated if needed.  Vitals:   12/31/16 1134  BP: 112/70  Pulse: 88  Temp: 98.6 F (37 C)    Review of Systems  Constitutional: Negative for fatigue and fever.  HENT: Negative for congestion.   Eyes: Negative for pain.  Respiratory: Negative for cough and shortness of breath.   Cardiovascular: Negative for chest pain, palpitations and leg swelling.  Gastrointestinal: Negative for abdominal pain.  Genitourinary: Negative for dysuria and vaginal bleeding.  Musculoskeletal: Negative for back pain.  Neurological: Negative for syncope, light-headedness and headaches.  Psychiatric/Behavioral: Negative for dysphoric mood.       Objective:   Physical Exam  Constitutional: Vital signs are normal. She appears well-developed and well-nourished. She is cooperative.  Non-toxic appearance. She does not appear ill. No distress.  HENT:  Head: Normocephalic.  Right  Ear: Hearing, tympanic membrane, external ear and ear canal normal.  Left Ear: Hearing, tympanic membrane, external ear and ear canal normal.  Nose: Nose normal.  Eyes: Conjunctivae, EOM and lids are normal. Pupils are equal, round, and reactive to light. Lids are everted and swept, no foreign bodies found.  Neck: Trachea normal and normal range of motion. Neck supple. Carotid bruit is not present. No thyroid mass and no thyromegaly present.  Cardiovascular: Normal rate, regular rhythm, S1 normal, S2 normal, normal heart sounds and intact distal pulses.  Exam reveals no gallop.   No murmur heard. Pulmonary/Chest: Effort normal and breath sounds normal. No respiratory distress. She has no wheezes. She has no rhonchi. She has no rales.  Abdominal: Soft. Normal appearance and bowel sounds are normal. She exhibits no distension, no fluid wave, no abdominal bruit and no mass. There is no hepatosplenomegaly. There is no tenderness. There is no rebound, no guarding and no CVA tenderness. No hernia.  Lymphadenopathy:    She has no cervical adenopathy.    She has no axillary adenopathy.  Neurological: She is alert. She has normal strength. No cranial nerve deficit or sensory deficit.  Skin: Skin is warm, dry and intact. No rash noted.  Psychiatric: Her speech is normal and behavior is normal. Judgment normal. Her mood appears not anxious. Cognition and memory are normal. She does not exhibit a depressed mood.          Assessment & Plan:  The patient's preventative maintenance and recommended  screening tests for an annual wellness exam were reviewed in full today. Brought up to date unless services declined.  Counselled on the importance of diet, exercise, and its role in overall health and mortality. The patient's FH and SH was reviewed, including their home life, tobacco status, and drug and alcohol status.   Vaccines: refuses flu and update Td.  Mammo: nml in last year  PAP/DVE: per Dr.  Helane Rima  nonsmoker  No ETOH.

## 2016-12-31 NOTE — Patient Instructions (Addendum)
Call if interested in counseling.  Follow up mood if not improving with counseling.  Look into atarax or hydroxyzine for anxiety with plane flight.  Can look into alprazolam for  Plane flight.

## 2016-12-31 NOTE — Progress Notes (Signed)
Pre visit review using our clinic review tool, if applicable. No additional management support is needed unless otherwise documented below in the visit note. 

## 2017-01-09 DIAGNOSIS — N920 Excessive and frequent menstruation with regular cycle: Secondary | ICD-10-CM | POA: Diagnosis not present

## 2017-01-09 DIAGNOSIS — N840 Polyp of corpus uteri: Secondary | ICD-10-CM | POA: Diagnosis not present

## 2017-01-22 ENCOUNTER — Encounter: Payer: Self-pay | Admitting: Family Medicine

## 2017-01-22 ENCOUNTER — Ambulatory Visit (INDEPENDENT_AMBULATORY_CARE_PROVIDER_SITE_OTHER): Payer: 59 | Admitting: Family Medicine

## 2017-01-22 DIAGNOSIS — R030 Elevated blood-pressure reading, without diagnosis of hypertension: Secondary | ICD-10-CM

## 2017-01-22 NOTE — Assessment & Plan Note (Signed)
Normotensive here again. Reassurance provided. Follow up as needed.

## 2017-01-22 NOTE — Progress Notes (Signed)
Subjective:   Patient ID: Rachel Bird, female    DOB: 11/27/1966, 51 y.o.   MRN: [id number]  Rachel Bird is a pleasant 51 y.o. year old female pt of Dr. Diona Browner, new to me, who presents to clinic today with Hypertension  on 01/22/2017  HPI:   Was seen by PCP last month for CPX- note reviewed from 12/31/16. Normotensive at that OV.  BP Readings from Last 3 Encounters:  01/22/17 (!) 130/98  12/31/16 112/70  12/15/15 100/60   Had surgery for uterine polyps and was told her BP was elevated prior to and after surgery.  She is unsure how high.  Denies any HA, blurred vision, CP or SOB.  No blurred vision.     No current outpatient prescriptions on file prior to visit.   No current facility-administered medications on file prior to visit.     No Known Allergies  Past Medical History:  Diagnosis Date  . Anemia   . GERD (gastroesophageal reflux disease)   . Hiatal hernia   . History of chicken pox   . MVP (mitral valve prolapse)     Past Surgical History:  Procedure Laterality Date  . UPPER GI ENDOSCOPY      Family History  Problem Relation Age of Onset  . Hyperlipidemia Mother   . Osteoporosis Mother   . Arthritis Mother   . Hyperthyroidism Brother   . Anxiety disorder Daughter   . GER disease Daughter   . Migraines Daughter   . Cancer Maternal Aunt     colon cancer and female cancer  . Cancer Maternal Grandmother     colon and female cancer?    Social History   Social History  . Marital status: Married    Spouse name: Inger Dandrea  . Number of children: 1  . Years of education: N/A   Occupational History  . management    Social History Main Topics  . Smoking status: Never Smoker  . Smokeless tobacco: Never Used  . Alcohol use No  . Drug use: No  . Sexual activity: Yes    Partners: Male     Comment: husband with vasectomy   Other Topics Concern  . Not on file   Social History Narrative  . No narrative on file   The PMH,  PSH, Social History, Family History, Medications, and allergies have been reviewed in [facility], and have been updated if relevant.   Review of Systems  Constitutional: Negative.   Eyes: Negative.   Respiratory: Negative.   Cardiovascular: Negative.   Gastrointestinal: Negative.   Musculoskeletal: Negative.   Skin: Negative.   Neurological: Negative.   Hematological: Negative.   Psychiatric/Behavioral: Negative.   All other systems reviewed and are negative.      Objective:    BP (!) 130/98   Pulse 91   Temp 98.5 F (36.9 C)   Ht 5' (1.524 m)   Wt 146 lb (66.2 kg)   SpO2 99%   BMI 28.51 kg/m    Physical Exam  Constitutional: She is oriented to person, place, and time. She appears well-developed and well-nourished. No distress.  HENT:  Head: Normocephalic and atraumatic.  Eyes: Conjunctivae are normal.  Cardiovascular: Regular rhythm.   Pulmonary/Chest: Effort normal and breath sounds normal. No respiratory distress.  Abdominal: Soft. Bowel sounds are normal.  Neurological: She is alert and oriented to person, place, and time. No cranial nerve deficit.  Skin: Skin is warm and dry. She is not diaphoretic.  Psychiatric: She has a normal mood and affect. Her behavior is normal. Judgment and thought content normal.  Nursing note and vitals reviewed.         Assessment & Plan:   Elevated blood pressure reading No Follow-up on file.

## 2017-01-24 NOTE — Assessment & Plan Note (Signed)
Resolved

## 2017-01-24 NOTE — Assessment & Plan Note (Signed)
AHA risk cal 1.2% risk in next 10 years for CVD. No indications for treatment.  Encouraged exercise, weight loss, healthy eating habits.

## 2017-02-06 ENCOUNTER — Ambulatory Visit: Payer: 59 | Admitting: Family Medicine

## 2017-02-07 ENCOUNTER — Encounter: Payer: Self-pay | Admitting: Family Medicine

## 2017-02-07 ENCOUNTER — Ambulatory Visit (INDEPENDENT_AMBULATORY_CARE_PROVIDER_SITE_OTHER): Payer: 59 | Admitting: Family Medicine

## 2017-02-07 DIAGNOSIS — Z0130 Encounter for examination of blood pressure without abnormal findings: Secondary | ICD-10-CM | POA: Diagnosis not present

## 2017-02-07 NOTE — Assessment & Plan Note (Signed)
Nml BP again in office. Encouraged exercise, weight loss, healthy eating habits.

## 2017-02-07 NOTE — Progress Notes (Signed)
   Subjective:    Patient ID: Rachel Bird, female    DOB: 02-Mar-1966, 51 y.o.   MRN: [id number]  HPI   51 year old female presents for BP follow up.  She saw Dr. Deborra Medina on 2/21 with BP elevation  She had surgery for uterine polyps and was told BP was elevated after surgery. BP Readings from Last 3 Encounters:  02/07/17 124/80  01/22/17 120/80  12/31/16 112/70     She returns today after noting BP at home 141/100. Arm cuff. No HA, no dizziness, no blurred vision.  no CP, no SOB. No edema.  No decongestants, minimal stress. Eats minimal salt.   MGM high blood grandmother.  Sister with hyperactive thyroid.  Blood pressure 124/80, pulse (!) 103, temperature 98.8 F (37.1 C), temperature source Oral, height 5' (1.524 m), weight 147 lb (66.7 kg).   Review of Systems  All other systems reviewed and are negative.      Objective:   Physical Exam  Constitutional: Vital signs are normal. She appears well-developed and well-nourished. She is cooperative.  Non-toxic appearance. She does not appear ill. No distress.  HENT:  Head: Normocephalic.  Right Ear: Hearing, tympanic membrane, external ear and ear canal normal. Tympanic membrane is not erythematous, not retracted and not bulging.  Left Ear: Hearing, tympanic membrane, external ear and ear canal normal. Tympanic membrane is not erythematous, not retracted and not bulging.  Nose: No mucosal edema or rhinorrhea. Right sinus exhibits no maxillary sinus tenderness and no frontal sinus tenderness. Left sinus exhibits no maxillary sinus tenderness and no frontal sinus tenderness.  Mouth/Throat: Uvula is midline, oropharynx is clear and moist and mucous membranes are normal.  Eyes: Conjunctivae, EOM and lids are normal. Pupils are equal, round, and reactive to light. Lids are everted and swept, no foreign bodies found.  Neck: Trachea normal and normal range of motion. Neck supple. Carotid bruit is not present. No thyroid mass and no  thyromegaly present.  Cardiovascular: Normal rate, regular rhythm, S1 normal, S2 normal, normal heart sounds, intact distal pulses and normal pulses.  Exam reveals no gallop and no friction rub.   No murmur heard. Pulmonary/Chest: Effort normal and breath sounds normal. No tachypnea. No respiratory distress. She has no decreased breath sounds. She has no wheezes. She has no rhonchi. She has no rales.  Abdominal: Soft. Normal appearance and bowel sounds are normal. There is no tenderness.  Neurological: She is alert.  Skin: Skin is warm, dry and intact. No rash noted.  Psychiatric: Her speech is normal and behavior is normal. Judgment and thought content normal. Her mood appears not anxious. Cognition and memory are normal. She does not exhibit a depressed mood.          Assessment & Plan:

## 2017-02-07 NOTE — Progress Notes (Signed)
Pre visit review using our clinic review tool, if applicable. No additional management support is needed unless otherwise documented below in the visit note. 

## 2017-02-07 NOTE — Patient Instructions (Signed)
Call if feeling poorly with shortness of breath chest pain , heart racing.

## 2017-06-09 DIAGNOSIS — Z1212 Encounter for screening for malignant neoplasm of rectum: Secondary | ICD-10-CM | POA: Diagnosis not present

## 2017-06-09 DIAGNOSIS — Z01419 Encounter for gynecological examination (general) (routine) without abnormal findings: Secondary | ICD-10-CM | POA: Diagnosis not present

## 2017-06-09 DIAGNOSIS — Z1231 Encounter for screening mammogram for malignant neoplasm of breast: Secondary | ICD-10-CM | POA: Diagnosis not present

## 2017-06-09 LAB — HM MAMMOGRAPHY

## 2017-10-01 DIAGNOSIS — N911 Secondary amenorrhea: Secondary | ICD-10-CM | POA: Diagnosis not present

## 2018-04-07 ENCOUNTER — Telehealth: Payer: Self-pay | Admitting: Family Medicine

## 2018-04-07 ENCOUNTER — Other Ambulatory Visit (INDEPENDENT_AMBULATORY_CARE_PROVIDER_SITE_OTHER): Payer: 59

## 2018-04-07 ENCOUNTER — Telehealth: Payer: Self-pay | Admitting: *Deleted

## 2018-04-07 DIAGNOSIS — E538 Deficiency of other specified B group vitamins: Secondary | ICD-10-CM | POA: Diagnosis not present

## 2018-04-07 DIAGNOSIS — Z0130 Encounter for examination of blood pressure without abnormal findings: Secondary | ICD-10-CM

## 2018-04-07 DIAGNOSIS — E7800 Pure hypercholesterolemia, unspecified: Secondary | ICD-10-CM

## 2018-04-07 DIAGNOSIS — Z1321 Encounter for screening for nutritional disorder: Secondary | ICD-10-CM

## 2018-04-07 DIAGNOSIS — D508 Other iron deficiency anemias: Secondary | ICD-10-CM

## 2018-04-07 LAB — COMPREHENSIVE METABOLIC PANEL
ALT: 12 U/L (ref 0–35)
AST: 14 U/L (ref 0–37)
Albumin: 4.2 g/dL (ref 3.5–5.2)
Alkaline Phosphatase: 50 U/L (ref 39–117)
BUN: 12 mg/dL (ref 6–23)
CHLORIDE: 105 meq/L (ref 96–112)
CO2: 25 meq/L (ref 19–32)
Calcium: 9 mg/dL (ref 8.4–10.5)
Creatinine, Ser: 0.83 mg/dL (ref 0.40–1.20)
GFR: 76.99 mL/min (ref >60.00)
Glucose, Bld: 82 mg/dL (ref 70–99)
Potassium: 3.7 mEq/L (ref 3.5–5.1)
Sodium: 140 mEq/L (ref 135–145)
Total Bilirubin: 0.4 mg/dL (ref 0.2–1.2)
Total Protein: 7 g/dL (ref 6.0–8.3)

## 2018-04-07 LAB — LIPID PANEL
CHOL/HDL RATIO: 4
Cholesterol: 205 mg/dL — ABNORMAL HIGH (ref 0–200)
HDL: 47.2 mg/dL (ref >39.00)
LDL CALC: 129 mg/dL — AB (ref 0–99)
NONHDL: 157.78
Triglycerides: 142 mg/dL (ref 0.0–149.0)
VLDL: 28.4 mg/dL (ref 0.0–40.0)

## 2018-04-07 LAB — VITAMIN D 25 HYDROXY (VIT D DEFICIENCY, FRACTURES): VITD: 16.07 ng/mL — AB (ref 30.00–100.00)

## 2018-04-07 LAB — VITAMIN B12: VITAMIN B 12: 406 pg/mL (ref 211–911)

## 2018-04-07 NOTE — Telephone Encounter (Signed)
Ms. Satchell notified as instructed by telephone.

## 2018-04-07 NOTE — Telephone Encounter (Signed)
-----   Message from Jinny Sanders, MD sent at 04/07/2018  4:47 PM EDT ----- Regarding: RE: Missed cbc   Donna:Can you let her know what happened and we can just do it at [place]?  Elder Negus  Amy  ----- Message ----- From: Ellamae Sia Sent: 04/07/2018  11:17 AM To: Jinny Sanders, MD Subject: Missed cbc                                     The CBC wasn't done, orders were funny it popped up after the pt was gone. Do you want me to call her back in or draw at her appt with you. Thanks, Karna Christmas

## 2018-04-07 NOTE — Telephone Encounter (Signed)
-----   Message from Ellamae Sia sent at 04/01/2018  4:39 PM EDT ----- Regarding: lab orders for Tuesday, 5.7.19 Patient is scheduled for CPX labs, please order future labs, Thanks , Terri  Patient is requesting Vit D testing

## 2018-04-07 NOTE — Addendum Note (Signed)
Addended by: Ellamae Sia on: 04/07/2018 09:50 AM   Modules accepted: Orders

## 2018-04-14 ENCOUNTER — Ambulatory Visit (INDEPENDENT_AMBULATORY_CARE_PROVIDER_SITE_OTHER): Payer: 59 | Admitting: Family Medicine

## 2018-04-14 ENCOUNTER — Encounter: Payer: Self-pay | Admitting: Family Medicine

## 2018-04-14 ENCOUNTER — Other Ambulatory Visit: Payer: Self-pay

## 2018-04-14 VITALS — BP 130/84 | HR 98 | Temp 98.9°F | Ht 63.25 in | Wt 153.0 lb

## 2018-04-14 DIAGNOSIS — E559 Vitamin D deficiency, unspecified: Secondary | ICD-10-CM | POA: Diagnosis not present

## 2018-04-14 DIAGNOSIS — E7800 Pure hypercholesterolemia, unspecified: Secondary | ICD-10-CM

## 2018-04-14 DIAGNOSIS — Z0000 Encounter for general adult medical examination without abnormal findings: Secondary | ICD-10-CM | POA: Diagnosis not present

## 2018-04-14 DIAGNOSIS — D508 Other iron deficiency anemias: Secondary | ICD-10-CM

## 2018-04-14 DIAGNOSIS — E538 Deficiency of other specified B group vitamins: Secondary | ICD-10-CM | POA: Diagnosis not present

## 2018-04-14 DIAGNOSIS — Z1321 Encounter for screening for nutritional disorder: Secondary | ICD-10-CM

## 2018-04-14 LAB — CBC WITH DIFFERENTIAL/PLATELET
BASOS ABS: 0.1 10*3/uL (ref 0.0–0.1)
Basophils Relative: 0.6 % (ref 0.0–3.0)
Eosinophils Absolute: 0.1 10*3/uL (ref 0.0–0.7)
Eosinophils Relative: 0.7 % (ref 0.0–5.0)
HCT: 39.5 % (ref 36.0–46.0)
Hemoglobin: 13.2 g/dL (ref 12.0–15.0)
LYMPHS ABS: 2.6 10*3/uL (ref 0.7–4.0)
Lymphocytes Relative: 27.1 % (ref 12.0–46.0)
MCHC: 33.5 g/dL (ref 30.0–36.0)
MCV: 90.1 fl (ref 78.0–100.0)
MONOS PCT: 5 % (ref 3.0–12.0)
Monocytes Absolute: 0.5 10*3/uL (ref 0.1–1.0)
NEUTROS PCT: 66.6 % (ref 43.0–77.0)
Neutro Abs: 6.5 10*3/uL (ref 1.4–7.7)
Platelets: 261 10*3/uL (ref 150.0–400.0)
RBC: 4.38 Mil/uL (ref 3.87–5.11)
RDW: 13.7 % (ref 11.5–15.5)
WBC: 9.7 10*3/uL (ref 4.0–10.5)

## 2018-04-14 MED ORDER — VITAMIN D (ERGOCALCIFEROL) 1.25 MG (50000 UNIT) PO CAPS: 50000.0000 [IU] | ORAL_CAPSULE | ORAL | 0 refills | Status: DC

## 2018-04-14 NOTE — Addendum Note (Signed)
Addended by: Lendon Collar on: 04/14/2018 02:51 PM   Modules accepted: Orders

## 2018-04-14 NOTE — Assessment & Plan Note (Signed)
Replete for 12 weeks then recheck with Dr. Helane Rima.

## 2018-04-14 NOTE — Assessment & Plan Note (Signed)
Work on healthy lifestyle, low risk.

## 2018-04-14 NOTE — Progress Notes (Signed)
Subjective:    Patient ID: Rachel Bird, female    DOB: [birth year], 52 y.o.   MRN: [id number]  HPI The patient is here for annual wellness exam and preventative care.     Elevated Cholesterol: She has had some increase in cholesterol LDL.. But remains at goal. Using medications without problems: Muscle aches:  Diet compliance: poor Exercise: minimal Other complaints:   Vit D def: remains low.   Sinus tachy:  occ palpitations only noting at night when quieter, not bothersome. Previously evaluated by Dr. Rockey Situ.   Social History /Family History/Past Medical History reviewed in detail and updated in EMR if needed.  Blood pressure 130/84, pulse 98, temperature 98.9 F (37.2 C), temperature source Oral, height 5' 3.25" (1.607 m), weight 153 lb (69.4 kg).  Review of Systems  Constitutional: Negative for fatigue and fever.  HENT: Negative for congestion.   Eyes: Negative for pain.  Respiratory: Negative for cough and shortness of breath.   Cardiovascular: Positive for palpitations. Negative for chest pain and leg swelling.  Gastrointestinal: Negative for abdominal pain.  Genitourinary: Negative for dysuria and vaginal bleeding.  Musculoskeletal: Negative for back pain.  Neurological: Positive for dizziness. Negative for syncope, light-headedness and headaches.  Psychiatric/Behavioral: Negative for dysphoric mood and self-injury. The patient is not nervous/anxious.        Objective:   Physical Exam  Constitutional: Vital signs are normal. She appears well-developed and well-nourished. She is cooperative.  Non-toxic appearance. She does not appear ill. No distress.  HENT:  Head: Normocephalic.  Right Ear: Hearing, tympanic membrane, external ear and ear canal normal.  Left Ear: Hearing, tympanic membrane, external ear and ear canal normal.  Nose: Nose normal.  Eyes: Pupils are equal, round, and reactive to light. Conjunctivae, EOM and lids are normal. Lids are everted and  swept, no foreign bodies found.  Neck: Trachea normal and normal range of motion. Neck supple. Carotid bruit is not present. No thyroid mass and no thyromegaly present.  Cardiovascular: Normal rate, regular rhythm, S1 normal, S2 normal, normal heart sounds and intact distal pulses. Exam reveals no gallop.  No murmur heard. Pulmonary/Chest: Effort normal and breath sounds normal. No respiratory distress. She has no wheezes. She has no rhonchi. She has no rales. No breast tenderness, discharge or bleeding.  Abdominal: Soft. Normal appearance and bowel sounds are normal. She exhibits no distension, no fluid wave, no abdominal bruit and no mass. There is no hepatosplenomegaly. There is no tenderness. There is no rebound, no guarding and no CVA tenderness. No hernia.  Genitourinary: Vagina normal and uterus normal. No breast tenderness, discharge or bleeding. Pelvic exam was performed with patient supine. There is no rash, tenderness or lesion on the right labia. There is no rash, tenderness or lesion on the left labia. Uterus is not enlarged and not tender. Cervix exhibits no motion tenderness, no discharge and no friability. Right adnexum displays no mass, no tenderness and no fullness. Left adnexum displays no mass, no tenderness and no fullness.  Lymphadenopathy:    She has no cervical adenopathy.    She has no axillary adenopathy.  Neurological: She is alert. She has normal strength. No cranial nerve deficit or sensory deficit.  Skin: Skin is warm, dry and intact. No rash noted.  Psychiatric: Her speech is normal and behavior is normal. Judgment normal. Her mood appears not anxious. Cognition and memory are normal. She does not exhibit a depressed mood.          Assessment &  Plan:  The patient's preventative maintenance and recommended screening tests for an annual wellness exam were reviewed in full today. Brought up to date unless services declined.  Counselled on the importance of diet,  exercise, and its role in overall health and mortality. The patient's FH and SH was reviewed, including their home life, tobacco status, and drug and alcohol status.   Vaccines: refuses flu and update Td. Mammo:per Dr. Helane Rima PAP/DVE: per Dr. Helane Rima nonsmoker No ETOH.

## 2018-04-14 NOTE — Patient Instructions (Addendum)
Increase water, decrease caffeine. Get back to low cholesteorl diet and increase exercsie as able.  Please stop at the lab to have labs drawn.

## 2018-04-14 NOTE — Assessment & Plan Note (Signed)
Resolved

## 2018-04-14 NOTE — Assessment & Plan Note (Signed)
Due for re-eval. 

## 2018-04-17 ENCOUNTER — Encounter: Payer: Self-pay | Admitting: Family Medicine

## 2018-05-27 DIAGNOSIS — D229 Melanocytic nevi, unspecified: Secondary | ICD-10-CM | POA: Diagnosis not present

## 2018-05-27 DIAGNOSIS — L570 Actinic keratosis: Secondary | ICD-10-CM | POA: Diagnosis not present

## 2018-07-04 ENCOUNTER — Other Ambulatory Visit: Payer: Self-pay | Admitting: Family Medicine

## 2018-07-06 NOTE — Telephone Encounter (Signed)
Last office visit 04/14/2018.  Refilled 04/14/2018 for #12 with no refills.   AVS states to replete for 12 weeks then recheck with Dr. Helane Rima.  Refill?

## 2018-12-02 HISTORY — PX: CERVICAL ABLATION: SHX5771

## 2019-01-13 ENCOUNTER — Encounter: Payer: Self-pay | Admitting: Family Medicine

## 2019-01-13 ENCOUNTER — Ambulatory Visit: Payer: 59 | Admitting: Family Medicine

## 2019-01-13 VITALS — BP 128/80 | HR 97 | Temp 98.4°F | Ht 63.25 in | Wt 152.8 lb

## 2019-01-13 DIAGNOSIS — J029 Acute pharyngitis, unspecified: Secondary | ICD-10-CM | POA: Diagnosis not present

## 2019-01-13 DIAGNOSIS — H6192 Disorder of left external ear, unspecified: Secondary | ICD-10-CM | POA: Diagnosis not present

## 2019-01-13 DIAGNOSIS — H9392 Unspecified disorder of left ear: Secondary | ICD-10-CM

## 2019-01-13 LAB — POCT RAPID STREP A (OFFICE): Rapid Strep A Screen: NEGATIVE

## 2019-01-13 NOTE — Progress Notes (Signed)
Dr. Frederico Hamman T. Archie Shea, MD, [facility] [place], [address] Phone: [phone] Fax: [phone]  01/13/2019  Patient: Rachel Bird, MRN: [id number], DOB: 25-Dec-1965, 53 y.o.  Primary Physician:  Jinny Sanders, MD   Chief Complaint  Patient presents with  . Sore Throat  . Knot Behind Ear   Subjective:   Rachel Bird is a 53 y.o. very pleasant female patient who presents with the following:  She is a very pleasant lady, she is been having a sore throat for the last few days.  She denies any other symptoms including runny nose, sinus pressure or pain, polyarthralgias or myalgias, dysuria, or basically any other symptoms.  She does have a small knot on the posterior aspect of her ear that is been present for about 2 weeks, and this hurt a little bit more initially.  Past Medical History, Surgical History, Social History, Family History, Problem List, Medications, and Allergies have been reviewed and updated if relevant.  Patient Active Problem List   Diagnosis Date Noted  . Vitamin D deficiency 04/14/2018  . Encounter for blood pressure examination 02/07/2017  . High cholesterol 05/03/2014  . Sinus tachycardia 02/02/2014  . B12 deficiency 08/26/2013  . Anemia, iron deficiency 04/30/2013  . MITRAL VALVE PROLAPSE 05/02/2009  . ALLERGIC RHINITIS 05/02/2009  . GERD 05/02/2009    Past Medical History:  Diagnosis Date  . Anemia   . GERD (gastroesophageal reflux disease)   . Hiatal hernia   . History of chicken pox   . MVP (mitral valve prolapse)     Past Surgical History:  Procedure Laterality Date  . UPPER GI ENDOSCOPY      Social History   Socioeconomic History  . Marital status: Married    Spouse name: Michalina Calbert  . Number of children: 1  . Years of education: Not on file  . Highest education level: Not on file  Occupational History  . Occupation: management  Social Needs  .  Financial resource strain: Not on file  . Food insecurity:    Worry: Not on file    Inability: Not on file  . Transportation needs:    Medical: Not on file    Non-medical: Not on file  Tobacco Use  . Smoking status: Never Smoker  . Smokeless tobacco: Never Used  Substance and Sexual Activity  . Alcohol use: No  . Drug use: No  . Sexual activity: Yes    Partners: Male    Comment: husband with vasectomy  Lifestyle  . Physical activity:    Days per week: Not on file    Minutes per session: Not on file  . Stress: Not on file  Relationships  . Social connections:    Talks on phone: Not on file    Gets together: Not on file    Attends religious service: Not on file    Active member of club or organization: Not on file    Attends meetings of clubs or organizations: Not on file    Relationship status: Not on file  . Intimate partner violence:    Fear of current or ex partner: Not on file    Emotionally abused: Not on file    Physically abused: Not on file    Forced sexual activity: Not on file  Other Topics Concern  . Not on file  Social History Narrative  . Not on file    Family History  Problem Relation Age of Onset  . Hyperlipidemia Mother   . Osteoporosis Mother   . Arthritis Mother   . Hyperthyroidism Brother   . Anxiety disorder Daughter   . GER disease Daughter   . Migraines Daughter   . Cancer Maternal Aunt        colon cancer and female cancer  . Cancer Maternal Grandmother        colon and female cancer?    No Known Allergies  Medication list reviewed and updated in full in [facility].  ROS: GEN: Acute illness details above GI: Tolerating PO intake GU: maintaining adequate hydration and urination Pulm: No SOB Interactive and getting along well at home.  Otherwise, ROS is as per the HPI.  Objective:   BP 128/80   Pulse 97   Temp 98.4 F (36.9 C) (Oral)   Ht 5' 3.25" (1.607 m)   Wt 152 lb 12 oz (69.3 kg)   BMI 26.84 kg/m    Gen:  WDWN, NAD; A & O x3, cooperative. Pleasant.Globally Non-toxic HEENT: Normocephalic and atraumatic. Throat: not swollen, no exudate R TM clear, L TM - good landmarks, No fluid present. rhinnorhea. No frontal or maxillary sinus T. MMM Behind the left ear there is a small elevated area on the ear itself.  Is grossly nontender to palpation. NECK: Anterior cervical  LAD is present - TTP CV: RRR, No M/G/R, cap refill <2 sec PULM: Breathing comfortably in no respiratory distress. no wheezing, crackles, rhonchi ABD: S,NT,ND,+BS. No HSM. No rebound. EXT: No c/c/e PSYCH: Friendly, good eye contact    Laboratory and Imaging Data: Results for orders placed or performed in visit on 01/13/19  POCT rapid strep A  Result Value Ref Range   Rapid Strep A Screen Negative Negative    Assessment and Plan:   Sore throat  Lesion of left ear  Rapid strep and culture.  Versus potential viral pharyngitis.  Lesion of ear, at this point 83 weeks old, observe only.  If it does not resolve, then theoretically could be potentially an early basal cell and would need dermatological evaluation.  More likely resolving boil.  Follow-up: No follow-ups on file.  Signed,  Maud Deed. Harlem Thresher, MD   Outpatient Encounter Medications as of 01/13/2019  Medication Sig  . Methylcobalamin (B12-ACTIVE) 1 MG CHEW Chew by mouth 2 (two) times daily.  . Vitamin D, Ergocalciferol, (DRISDOL) 50000 units CAPS capsule TAKE 1 CAPSULE (50,000 UNITS TOTAL) BY MOUTH EVERY 7 (SEVEN) DAYS.   No facility-administered encounter medications on file as of 01/13/2019.

## 2019-01-15 LAB — CULTURE, GROUP A STREP
MICRO NUMBER:: 186004
SPECIMEN QUALITY:: ADEQUATE

## 2019-02-05 ENCOUNTER — Other Ambulatory Visit: Payer: Self-pay | Admitting: Physician Assistant

## 2019-02-05 DIAGNOSIS — L570 Actinic keratosis: Secondary | ICD-10-CM | POA: Diagnosis not present

## 2019-02-05 DIAGNOSIS — D485 Neoplasm of uncertain behavior of skin: Secondary | ICD-10-CM | POA: Diagnosis not present

## 2019-02-25 ENCOUNTER — Encounter: Payer: Self-pay | Admitting: *Deleted

## 2019-03-01 DIAGNOSIS — Z01419 Encounter for gynecological examination (general) (routine) without abnormal findings: Secondary | ICD-10-CM | POA: Diagnosis not present

## 2019-03-01 DIAGNOSIS — E559 Vitamin D deficiency, unspecified: Secondary | ICD-10-CM | POA: Diagnosis not present

## 2019-03-01 DIAGNOSIS — Z6826 Body mass index (BMI) 26.0-26.9, adult: Secondary | ICD-10-CM | POA: Diagnosis not present

## 2019-03-01 LAB — HM MAMMOGRAPHY

## 2019-03-01 LAB — HM PAP SMEAR: HM Pap smear: NEGATIVE

## 2019-04-13 ENCOUNTER — Other Ambulatory Visit: Payer: 59

## 2019-04-20 ENCOUNTER — Encounter: Payer: 59 | Admitting: Family Medicine

## 2019-07-06 ENCOUNTER — Other Ambulatory Visit (INDEPENDENT_AMBULATORY_CARE_PROVIDER_SITE_OTHER): Payer: 59

## 2019-07-06 ENCOUNTER — Other Ambulatory Visit: Payer: Self-pay

## 2019-07-06 ENCOUNTER — Telehealth: Payer: Self-pay | Admitting: Family Medicine

## 2019-07-06 DIAGNOSIS — E559 Vitamin D deficiency, unspecified: Secondary | ICD-10-CM | POA: Diagnosis not present

## 2019-07-06 DIAGNOSIS — E7800 Pure hypercholesterolemia, unspecified: Secondary | ICD-10-CM

## 2019-07-06 DIAGNOSIS — E538 Deficiency of other specified B group vitamins: Secondary | ICD-10-CM | POA: Diagnosis not present

## 2019-07-06 LAB — COMPREHENSIVE METABOLIC PANEL
ALT: 10 U/L (ref 0–35)
AST: 9 U/L (ref 0–37)
Albumin: 4.5 g/dL (ref 3.5–5.2)
Alkaline Phosphatase: 61 U/L (ref 39–117)
BUN: 16 mg/dL (ref 6–23)
CO2: 27 mEq/L (ref 19–32)
Calcium: 9.3 mg/dL (ref 8.4–10.5)
Chloride: 107 mEq/L (ref 96–112)
Creatinine, Ser: 1.01 mg/dL (ref 0.40–1.20)
GFR: 57.47 mL/min — ABNORMAL LOW (ref >60.00)
Glucose, Bld: 100 mg/dL — ABNORMAL HIGH (ref 70–99)
Potassium: 4 mEq/L (ref 3.5–5.1)
Sodium: 142 mEq/L (ref 135–145)
Total Bilirubin: 0.3 mg/dL (ref 0.2–1.2)
Total Protein: 6.7 g/dL (ref 6.0–8.3)

## 2019-07-06 LAB — LIPID PANEL
Cholesterol: 224 mg/dL — ABNORMAL HIGH (ref 0–200)
HDL: 46.7 mg/dL (ref >39.00)
LDL Cholesterol: 157 mg/dL — ABNORMAL HIGH (ref 0–99)
NonHDL: 177.59
Total CHOL/HDL Ratio: 5
Triglycerides: 102 mg/dL (ref 0.0–149.0)
VLDL: 20.4 mg/dL (ref 0.0–40.0)

## 2019-07-06 LAB — VITAMIN B12: Vitamin B-12: 298 pg/mL (ref 211–911)

## 2019-07-06 LAB — VITAMIN D 25 HYDROXY (VIT D DEFICIENCY, FRACTURES): VITD: 22.03 ng/mL — ABNORMAL LOW (ref 30.00–100.00)

## 2019-07-06 NOTE — Telephone Encounter (Signed)
-----   Message from Ellamae Sia sent at 06/30/2019 12:36 PM EDT ----- Regarding: Lab orders for Tuesday, 8.4.20 Patient is scheduled for CPX labs, please order future labs, Thanks , Karna Christmas

## 2019-07-06 NOTE — Progress Notes (Signed)
No critical labs need to be addressed urgently. We will discuss labs in detail at upcoming office visit.   

## 2019-07-09 ENCOUNTER — Other Ambulatory Visit: Payer: Self-pay

## 2019-07-09 ENCOUNTER — Ambulatory Visit (INDEPENDENT_AMBULATORY_CARE_PROVIDER_SITE_OTHER): Payer: 59 | Admitting: Family Medicine

## 2019-07-09 VITALS — BP 120/84 | HR 93 | Temp 98.4°F | Ht 63.0 in | Wt 148.0 lb

## 2019-07-09 DIAGNOSIS — Z0000 Encounter for general adult medical examination without abnormal findings: Secondary | ICD-10-CM

## 2019-07-09 DIAGNOSIS — E559 Vitamin D deficiency, unspecified: Secondary | ICD-10-CM | POA: Diagnosis not present

## 2019-07-09 DIAGNOSIS — E7800 Pure hypercholesterolemia, unspecified: Secondary | ICD-10-CM

## 2019-07-09 NOTE — Assessment & Plan Note (Signed)
Work on [employer] . 4.7 % risk CAD in 10 years per [facility] risk calculator

## 2019-07-09 NOTE — Assessment & Plan Note (Signed)
Replete

## 2019-07-09 NOTE — Patient Instructions (Addendum)
Start vit D3 400 Units 1-2 two daily  Get back  To regualr exercsie 3-5 days a week, work on low cholesterol diet.

## 2019-07-09 NOTE — Progress Notes (Signed)
Chief Complaint  Patient presents with  . Annual Exam    History of Present Illness: HPI The patient is here for annual wellness exam and preventative care.    Elevated Cholesterol:  4.7 % risk CAD in 10 years per AHA risk calculator  Lab Results  Component Value Date   CHOL 224 (H) 07/06/2019   HDL 46.70 07/06/2019   LDLCALC 157 (H) 07/06/2019   LDLDIRECT 152.1 04/30/2013   TRIG 102.0 07/06/2019   CHOLHDL 5 07/06/2019  Using medications without problems: Muscle aches:  Diet compliance: moderate Exercise: walkings Other complaints:  Vit D def: remains low, but improved  Sinus tachy: occ palpitations only noting at night when quieter, not bothersome. Previously evaluated by Dr. Rockey Situ.  COVID 19 screen No recent travel or known exposure to COVID19 The patient denies respiratory symptoms of COVID 19 at this time.  The importance of social distancing was discussed today.   Review of Systems  Constitutional: Negative for chills, fever and malaise/fatigue.  HENT: Negative for congestion and ear pain.   Eyes: Negative for pain and redness.  Respiratory: Negative for cough and shortness of breath.   Cardiovascular: Negative for chest pain, palpitations and leg swelling.  Gastrointestinal: Negative for abdominal pain, blood in stool, constipation, diarrhea, nausea and vomiting.  Genitourinary: Negative for dysuria.  Musculoskeletal: Negative for falls and myalgias.  Skin: Negative for rash.  Neurological: Negative for dizziness.  Psychiatric/Behavioral: Negative for depression. The patient is not nervous/anxious.       Past Medical History:  Diagnosis Date  . Anemia   . Atypical nevus 06/24/2005   left thigh anterior  . BCC (basal cell carcinoma) superficial 06/24/2005   right shin medial  . BCC (basal cell carcinoma) superficial 01/02/2007   right top sholder  . BCC (basal cell carcinoma) superficial 01/25/2008   left scapula  . GERD (gastroesophageal reflux  disease)   . Hiatal hernia   . History of chicken pox   . mild 09/16/2012   right cheek  . moderate-severe 09/16/2012   mid abdomen  . MVP (mitral valve prolapse)   . severe 09/16/2012   upper paraspinal  . slight 06/24/2005   left outer ankle  . slight 09/16/2012   mid chest    reports that she has never smoked. She has never used smokeless tobacco. She reports that she does not drink alcohol or use drugs.   Current Outpatient Medications:  Marland Kitchen  Methylcobalamin (B12-ACTIVE) 1 MG CHEW, Chew by mouth 2 (two) times daily., Disp: , Rfl:  .  Vitamin D, Ergocalciferol, (DRISDOL) 50000 units CAPS capsule, TAKE 1 CAPSULE (50,000 UNITS TOTAL) BY MOUTH EVERY 7 (SEVEN) DAYS., Disp: 12 capsule, Rfl: 0   Observations/Objective: Blood pressure 120/84, pulse 93, temperature 98.4 F (36.9 C), temperature source Temporal, height 5\' 3"  (1.6 m), weight 148 lb (67.1 kg), SpO2 95 %.   Physical Exam Constitutional:      General: She is not in acute distress.    Appearance: Normal appearance. She is well-developed. She is not ill-appearing or toxic-appearing.  HENT:     Head: Normocephalic.     Right Ear: Hearing, tympanic membrane, ear canal and external ear normal.     Left Ear: Hearing, tympanic membrane, ear canal and external ear normal.     Nose: Nose normal.  Eyes:     General: Lids are normal. Lids are everted, no foreign bodies appreciated.     Conjunctiva/sclera: Conjunctivae normal.     Pupils: Pupils are equal,  round, and reactive to light.  Neck:     Musculoskeletal: Normal range of motion and neck supple.     Thyroid: No thyroid mass or thyromegaly.     Vascular: No carotid bruit.     Trachea: Trachea normal.  Cardiovascular:     Rate and Rhythm: Normal rate and regular rhythm.     Heart sounds: Normal heart sounds, S1 normal and S2 normal. No murmur. No gallop.   Pulmonary:     Effort: Pulmonary effort is normal. No respiratory distress.     Breath sounds: Normal breath  sounds. No wheezing, rhonchi or rales.  Abdominal:     General: Bowel sounds are normal. There is no distension or abdominal bruit.     Palpations: Abdomen is soft. There is no fluid wave or mass.     Tenderness: There is no abdominal tenderness. There is no guarding or rebound.     Hernia: No hernia is present.  Lymphadenopathy:     Cervical: No cervical adenopathy.  Skin:    General: Skin is warm and dry.     Findings: No rash.  Neurological:     Mental Status: She is alert.     Cranial Nerves: No cranial nerve deficit.     Sensory: No sensory deficit.  Psychiatric:        Mood and Affect: Mood is not anxious or depressed.        Speech: Speech normal.        Behavior: Behavior normal. Behavior is cooperative.        Judgment: Judgment normal.      Assessment and Plan   The patient's preventative maintenance and recommended screening tests for an annual wellness exam were reviewed in full today. Brought up to date unless services declined.  Counselled on the importance of diet, exercise, and its role in overall health and mortality. The patient's FH and SH was reviewed, including their home life, tobacco status, and drug and alcohol status.    Vaccines: refuses flu andupdateTd. Mammo:per Dr. Helane Rima PAP/DVE: per Dr. Helane Rima nonsmoker No ETOH. Colonscopy:  2014 repeat in 10 years. Dr. Vira Agar.  Eliezer Lofts, MD

## 2019-07-12 ENCOUNTER — Encounter: Payer: Self-pay | Admitting: Family Medicine

## 2019-10-07 ENCOUNTER — Telehealth: Payer: Self-pay

## 2019-10-07 NOTE — Telephone Encounter (Signed)
Pt said starting this morning she was talking and looking back and forth at people and suddenly got lightheaded to the point of unstable on her feet and nauseated.pt said her heart rate has been between 100-108 which is usual for pt. Pt said she had short period of time when difficult to focus; no blurred or double vision. after resting for about 1 hr;pt stood up and dizziness was better. Now no dizziness at all. pt said 1 hr ago P was in 80's. Pt does not have way to ck BP.No CP,SOB,or H/A.Pt has no covid symptoms, no travel and no known exposure to + covid. Pt wanted to see Dr Diona Browner; pt scheduled in office appt on 10/08/19 at 9 AM. UC & ED precautions given and pt voiced understanding.FYI to Dr Diona Browner.

## 2019-10-08 ENCOUNTER — Encounter: Payer: Self-pay | Admitting: Family Medicine

## 2019-10-08 ENCOUNTER — Ambulatory Visit: Payer: 59 | Admitting: Family Medicine

## 2019-10-08 ENCOUNTER — Other Ambulatory Visit: Payer: Self-pay

## 2019-10-08 VITALS — BP 120/80 | HR 98 | Temp 98.3°F | Ht 63.0 in | Wt 150.5 lb

## 2019-10-08 DIAGNOSIS — H8110 Benign paroxysmal vertigo, unspecified ear: Secondary | ICD-10-CM | POA: Diagnosis not present

## 2019-10-08 DIAGNOSIS — Z23 Encounter for immunization: Secondary | ICD-10-CM | POA: Diagnosis not present

## 2019-10-08 NOTE — Progress Notes (Signed)
Chief Complaint  Patient presents with  . Dizziness    History of Present Illness: HPI  53 year old female with sinus tachycardiapresents for new onset  dizziness intermittent in last 24 hours.   When she was scrolling through emails 2 months ago... felt motion  Sick, lightheaded imbalance... improves after  30 min. That resolved until yesterday. When she was talking to multiple people she had sudden onset  vison cahnge, motion sickness, nausea  And hot flashes.  No presyncope.. more vertigo, off balance feeling suddenly.   Sat down and rested, felt better looking down.   Looking up made vertigo return. After 30 min she felt better. No vertgio since. Feels back to normal self.   Has chronic tinnitus... no changes, no hearing loss. No headache, no numbness, no weakness, no new neuro changes . HR yesterday was  79-108 which is usual for her. COVID 19 screen No recent travel or known exposure to COVID19 The patient denies respiratory symptoms of COVID 19 at this time.  The importance of social distancing was discussed today.   ROS    Past Medical History:  Diagnosis Date  . Anemia   . Atypical nevus 06/24/2005   left thigh anterior  . BCC (basal cell carcinoma) superficial 06/24/2005   right shin medial  . BCC (basal cell carcinoma) superficial 01/02/2007   right top sholder  . BCC (basal cell carcinoma) superficial 01/25/2008   left scapula  . GERD (gastroesophageal reflux disease)   . Hiatal hernia   . History of chicken pox   . mild 09/16/2012   right cheek  . moderate-severe 09/16/2012   mid abdomen  . MVP (mitral valve prolapse)   . severe 09/16/2012   upper paraspinal  . slight 06/24/2005   left outer ankle  . slight 09/16/2012   mid chest    reports that she has never smoked. She has never used smokeless tobacco. She reports that she does not drink alcohol or use drugs.   Current Outpatient Medications:  Marland Kitchen  Methylcobalamin (B12-ACTIVE) 1 MG CHEW, Chew  by mouth 2 (two) times daily., Disp: , Rfl:  .  Vitamin D, Ergocalciferol, (DRISDOL) 50000 units CAPS capsule, TAKE 1 CAPSULE (50,000 UNITS TOTAL) BY MOUTH EVERY 7 (SEVEN) DAYS., Disp: 12 capsule, Rfl: 0   Observations/Objective: Blood pressure 120/80, pulse 98, temperature 98.3 F (36.8 C), temperature source Temporal, height 5\' 3"  (1.6 m), weight 150 lb 8 oz (68.3 kg), SpO2 96 %.  Physical Exam Constitutional:      General: She is not in acute distress.    Appearance: Normal appearance. She is well-developed. She is not ill-appearing or toxic-appearing.  HENT:     Head: Normocephalic.     Right Ear: Hearing, tympanic membrane, ear canal and external ear normal. Tympanic membrane is not erythematous, retracted or bulging.     Left Ear: Hearing, tympanic membrane, ear canal and external ear normal. Tympanic membrane is not erythematous, retracted or bulging.     Nose: No mucosal edema or rhinorrhea.     Right Sinus: No maxillary sinus tenderness or frontal sinus tenderness.     Left Sinus: No maxillary sinus tenderness or frontal sinus tenderness.     Mouth/Throat:     Pharynx: Uvula midline.  Eyes:     General: Lids are normal. Lids are everted, no foreign bodies appreciated.     Conjunctiva/sclera: Conjunctivae normal.     Pupils: Pupils are equal, round, and reactive to light.  Neck:  Musculoskeletal: Normal range of motion and neck supple.     Thyroid: No thyroid mass or thyromegaly.     Vascular: No carotid bruit.     Trachea: Trachea normal.  Cardiovascular:     Rate and Rhythm: Normal rate and regular rhythm.     Pulses: Normal pulses.     Heart sounds: Normal heart sounds, S1 normal and S2 normal. No murmur. No friction rub. No gallop.   Pulmonary:     Effort: Pulmonary effort is normal. No tachypnea or respiratory distress.     Breath sounds: Normal breath sounds. No decreased breath sounds, wheezing, rhonchi or rales.  Abdominal:     General: Bowel sounds are normal.      Palpations: Abdomen is soft.     Tenderness: There is no abdominal tenderness.  Skin:    General: Skin is warm and dry.     Findings: No rash.  Neurological:     Mental Status: She is alert and oriented to person, place, and time.     GCS: GCS eye subscore is 4. GCS verbal subscore is 5. GCS motor subscore is 6.     Cranial Nerves: No cranial nerve deficit.     Sensory: No sensory deficit.     Motor: No abnormal muscle tone.     Coordination: Coordination normal.     Gait: Gait normal.     Deep Tendon Reflexes: Reflexes are normal and symmetric.     Comments: Nml cerebellar exam   No papilledema  Psychiatric:        Mood and Affect: Mood is not anxious or depressed.        Speech: Speech normal.        Behavior: Behavior normal. Behavior is cooperative.        Thought Content: Thought content normal.        Cognition and Memory: Memory is not impaired. She does not exhibit impaired recent memory or impaired remote memory.        Judgment: Judgment normal.      Assessment and Plan BPPV (benign paroxysmal positional vertigo) Now resolved. GIven desensitizations exercises to do if recurs.  No red flags and normal neuro exam.   Has tinnitus chronic but no hearing loss... if develops consider ENT referral for possible Menie[place].       Eliezer Lofts, MD

## 2019-10-08 NOTE — Patient Instructions (Signed)
Benign Positional Vertigo Vertigo is the feeling that you or your surroundings are moving when they are not. Benign positional vertigo is the most common form of vertigo. This is usually a harmless condition (benign). This condition is positional. This means that symptoms are triggered by certain movements and positions. This condition can be dangerous if it occurs while you are doing something that could cause harm to you or others. This includes activities such as driving or operating machinery. What are the causes? In many cases, the cause of this condition is not known. It may be caused by a disturbance in an area of the inner ear that helps your brain to sense movement and balance. This disturbance can be caused by:  Viral infection (labyrinthitis).  Head injury.  Repetitive motion, such as jumping, dancing, or running. What increases the risk? You are more likely to develop this condition if:  You are a woman.  You are 50 years of age or older. What are the signs or symptoms? Symptoms of this condition usually happen when you move your head or your eyes in different directions. Symptoms may start suddenly, and usually last for less than a minute. They include:  Loss of balance and falling.  Feeling like you are spinning or moving.  Feeling like your surroundings are spinning or moving.  Nausea and vomiting.  Blurred vision.  Dizziness.  Involuntary eye movement (nystagmus). Symptoms can be mild and cause only minor problems, or they can be severe and interfere with daily life. Episodes of benign positional vertigo may return (recur) over time. Symptoms may improve over time. How is this diagnosed? This condition may be diagnosed based on:  Your medical history.  Physical exam of the head, neck, and ears.  Tests, such as: ? MRI. ? CT scan. ? Eye movement tests. Your health care provider may ask you to change positions quickly while he or she watches you for symptoms  of benign positional vertigo, such as nystagmus. Eye movement may be tested with a variety of exams that are designed to evaluate or stimulate vertigo. ? An electroencephalogram (EEG). This records electrical activity in your brain. ? Hearing tests. You may be referred to a health care provider who specializes in ear, nose, and throat (ENT) problems (otolaryngologist) or a provider who specializes in disorders of the nervous system (neurologist). How is this treated?  This condition may be treated in a session in which your health care provider moves your head in specific positions to adjust your inner ear back to normal. Treatment for this condition may take several sessions. Surgery may be needed in severe cases, but this is rare. In some cases, benign positional vertigo may resolve on its own in 2-4 weeks. Follow these instructions at home: Safety  Move slowly. Avoid sudden body or head movements or certain positions, as told by your health care provider.  Avoid driving until your health care provider says it is safe for you to do so.  Avoid operating heavy machinery until your health care provider says it is safe for you to do so.  Avoid doing any tasks that would be dangerous to you or others if vertigo occurs.  If you have trouble walking or keeping your balance, try using a cane for stability. If you feel dizzy or unstable, sit down right away.  Return to your normal activities as told by your health care provider. Ask your health care provider what activities are safe for you. General instructions  Take over-the-counter   and prescription medicines only as told by your health care provider.  Drink enough fluid to keep your urine pale yellow.  Keep all follow-up visits as told by your health care provider. This is important. Contact a health care provider if:  You have a fever.  Your condition gets worse or you develop new symptoms.  Your family or friends notice any  behavioral changes.  You have nausea or vomiting that gets worse.  You have numbness or a "pins and needles" sensation. Get help right away if you:  Have difficulty speaking or moving.  Are always dizzy.  Faint.  Develop severe headaches.  Have weakness in your legs or arms.  Have changes in your hearing or vision.  Develop a stiff neck.  Develop sensitivity to light. Summary  Vertigo is the feeling that you or your surroundings are moving when they are not. Benign positional vertigo is the most common form of vertigo.  The cause of this condition is not known. It may be caused by a disturbance in an area of the inner ear that helps your brain to sense movement and balance.  Symptoms include loss of balance and falling, feeling that you or your surroundings are moving, nausea and vomiting, and blurred vision.  This condition can be diagnosed based on symptoms, physical exam, and other tests, such as MRI, CT scan, eye movement tests, and hearing tests.  Follow safety instructions as told by your health care provider. You will also be told when to contact your health care provider in case of problems. This information is not intended to replace advice given to you by your health care provider. Make sure you discuss any questions you have with your health care provider. Document Released: 08/26/2006 Document Revised: 04/29/2018 Document Reviewed: 04/29/2018 Elsevier Patient Education  2020 [employer]  

## 2019-10-08 NOTE — Telephone Encounter (Signed)
Noted  

## 2019-10-08 NOTE — Assessment & Plan Note (Signed)
Now resolved. GIven desensitizations exercises to do if recurs.  No red flags and normal neuro exam.   Has tinnitus chronic but no hearing loss... if develops consider ENT referral for possible [place].

## 2020-02-17 ENCOUNTER — Other Ambulatory Visit: Payer: Self-pay

## 2020-02-17 ENCOUNTER — Encounter: Payer: Self-pay | Admitting: Family Medicine

## 2020-02-17 ENCOUNTER — Ambulatory Visit: Payer: 59 | Admitting: Family Medicine

## 2020-02-17 VITALS — BP 118/82 | HR 88 | Temp 97.6°F | Ht 63.0 in | Wt 155.8 lb

## 2020-02-17 DIAGNOSIS — E538 Deficiency of other specified B group vitamins: Secondary | ICD-10-CM | POA: Diagnosis not present

## 2020-02-17 DIAGNOSIS — R21 Rash and other nonspecific skin eruption: Secondary | ICD-10-CM | POA: Diagnosis not present

## 2020-02-17 DIAGNOSIS — M6740 Ganglion, unspecified site: Secondary | ICD-10-CM | POA: Diagnosis not present

## 2020-02-17 LAB — BASIC METABOLIC PANEL
BUN: 15 mg/dL (ref 6–23)
CO2: 25 mEq/L (ref 19–32)
Calcium: 9.3 mg/dL (ref 8.4–10.5)
Chloride: 107 mEq/L (ref 96–112)
Creatinine, Ser: 0.93 mg/dL (ref 0.40–1.20)
GFR: 63.06 mL/min (ref >60.00)
Glucose, Bld: 92 mg/dL (ref 70–99)
Potassium: 4.1 mEq/L (ref 3.5–5.1)
Sodium: 139 mEq/L (ref 135–145)

## 2020-02-17 LAB — VITAMIN B12: Vitamin B-12: 1073 pg/mL — ABNORMAL HIGH (ref 211–911)

## 2020-02-17 LAB — HEMOGLOBIN A1C: Hgb A1c MFr Bld: 5.6 % (ref 4.6–6.5)

## 2020-02-17 MED ORDER — FLUCONAZOLE 200 MG PO TABS: 200.0000 mg | ORAL_TABLET | Freq: Every day | ORAL | 0 refills | Status: DC

## 2020-02-17 NOTE — Patient Instructions (Signed)
Complete fluconazole for 7 days.  We will call with yeast test. The hand specialist will call to set up the referral in next few weeks.

## 2020-02-17 NOTE — Assessment & Plan Note (Signed)
Re-eval. 

## 2020-02-17 NOTE — Assessment & Plan Note (Signed)
Bothersome to patient with typing.  Refer to hand specialist for consideration of aspiration vs removal.

## 2020-02-17 NOTE — Progress Notes (Signed)
Chief Complaint  Patient presents with  . Rash    around mouth    History of Present Illness: HPI    54 year old female presents with new onset rash around mouth.   She reports she first noted a rash under bilateral breasts. Treated by Dermatologist 01/04/2020.. treated for staph infeciton.. started on cephalexin. Improved but did not resolved.. the treated with 14 days doxycycline. Rash resolved At same time small area noted at side of mouth  Now rough  diplips, redness at edges of mouth. Slight burning and itchy. No white plaque on tounge.  Lip sensitivity. Treated with fluconazole 200 mg  3 tablets over 6 days. IMproved temporarily.. 1 week came back.    History of basal cell carcinoma    Also noted small lesion on right lower lid.   She is chronically tired, no change with fatigue, some increase thirst.  This visit occurred during the SARS-CoV-2 public health emergency.  Safety protocols were in place, including screening questions prior to the visit, additional usage of staff PPE, and extensive cleaning of exam room while observing appropriate contact time as indicated for disinfecting solutions.   COVID 19 screen:  No recent travel or known exposure to COVID19 The patient denies respiratory symptoms of COVID 19 at this time. The importance of social distancing was discussed today.     Review of Systems  Constitutional: Negative for chills and fever.  HENT: Negative for congestion and ear pain.   Eyes: Negative for pain and redness.  Respiratory: Negative for cough and shortness of breath.   Cardiovascular: Negative for chest pain, palpitations and leg swelling.  Gastrointestinal: Negative for abdominal pain, blood in stool, constipation, diarrhea, nausea and vomiting.  Genitourinary: Negative for dysuria.  Musculoskeletal: Negative for falls and myalgias.  Skin: Negative for rash.  Neurological: Negative for dizziness.  Psychiatric/Behavioral: Negative for  depression. The patient is not nervous/anxious.       Past Medical History:  Diagnosis Date  . Anemia   . Atypical nevus 06/24/2005   left thigh anterior  . BCC (basal cell carcinoma) superficial 06/24/2005   right shin medial  . BCC (basal cell carcinoma) superficial 01/02/2007   right top sholder  . BCC (basal cell carcinoma) superficial 01/25/2008   left scapula  . GERD (gastroesophageal reflux disease)   . Hiatal hernia   . History of chicken pox   . mild 09/16/2012   right cheek  . moderate-severe 09/16/2012   mid abdomen  . MVP (mitral valve prolapse)   . severe 09/16/2012   upper paraspinal  . slight 06/24/2005   left outer ankle  . slight 09/16/2012   mid chest    reports that she has never smoked. She has never used smokeless tobacco. She reports that she does not drink alcohol or use drugs.   Current Outpatient Medications:  Marland Kitchen  Methylcobalamin (B12-ACTIVE) 1 MG CHEW, Chew by mouth 2 (two) times daily., Disp: , Rfl:  .  VITAMIN D PO, Take 1 tablet by mouth daily., Disp: , Rfl:    Observations/Objective: Blood pressure 118/82, pulse 88, temperature 97.6 F (36.4 C), temperature source Temporal, height 5\' 3"  (1.6 m), weight 155 lb 12 oz (70.6 kg), last menstrual period 12/07/2015, SpO2 98 %.  Physical Exam Constitutional:      General: She is not in acute distress.    Appearance: Normal appearance. She is well-developed. She is not ill-appearing or toxic-appearing.  HENT:     Head: Normocephalic.  Right Ear: Hearing, tympanic membrane, ear canal and external ear normal. Tympanic membrane is not erythematous, retracted or bulging.     Left Ear: Hearing, tympanic membrane, ear canal and external ear normal. Tympanic membrane is not erythematous, retracted or bulging.     Nose: No mucosal edema or rhinorrhea.     Right Sinus: No maxillary sinus tenderness or frontal sinus tenderness.     Left Sinus: No maxillary sinus tenderness or frontal sinus tenderness.      Mouth/Throat:     Pharynx: Uvula midline.  Eyes:     General: Lids are normal. Lids are everted, no foreign bodies appreciated.     Conjunctiva/sclera: Conjunctivae normal.     Pupils: Pupils are equal, round, and reactive to light.  Neck:     Thyroid: No thyroid mass or thyromegaly.     Vascular: No carotid bruit.     Trachea: Trachea normal.  Cardiovascular:     Rate and Rhythm: Normal rate and regular rhythm.     Pulses: Normal pulses.     Heart sounds: Normal heart sounds, S1 normal and S2 normal. No murmur. No friction rub. No gallop.   Pulmonary:     Effort: Pulmonary effort is normal. No tachypnea or respiratory distress.     Breath sounds: Normal breath sounds. No decreased breath sounds, wheezing, rhonchi or rales.  Abdominal:     General: Bowel sounds are normal.     Palpations: Abdomen is soft.     Tenderness: There is no abdominal tenderness.  Musculoskeletal:     Cervical back: Normal range of motion and neck supple.     Comments: Mobile  Swelling on right palmar aspect of wrist consistent with ganglion cyst  Skin:    General: Skin is warm and dry.     Findings: No rash.  Neurological:     Mental Status: She is alert.  Psychiatric:        Mood and Affect: Mood is not anxious or depressed.        Speech: Speech normal.        Behavior: Behavior normal. Behavior is cooperative.        Thought Content: Thought content normal.        Judgment: Judgment normal.      Assessment and Plan   Rash of mouth present on examination Sent out yeast culture. Treat with longer course of fluconazole. Check B12.  Given possible recurrent yeast infections.. eval for glucose elevation.  B12 deficiency Re-eval.  Ganglion cyst Bothersome to patient with typing.  Refer to hand specialist for consideration of aspiration vs removal.     Eliezer Lofts, MD

## 2020-02-17 NOTE — Assessment & Plan Note (Signed)
Sent out yeast culture. Treat with longer course of fluconazole. Check B12.  Given possible recurrent yeast infections.. eval for glucose elevation.

## 2020-02-28 LAB — FUNGUS CULTURE W SMEAR
MICRO NUMBER:: 10266399
SMEAR:: NONE SEEN
SPECIMEN QUALITY:: ADEQUATE

## 2020-03-21 ENCOUNTER — Telehealth: Payer: Self-pay

## 2020-03-21 NOTE — Telephone Encounter (Signed)
Angelica with [facility] left v/m requesting urgent request for status of medical records for 5 years. Fax # [phone] and case # [id number].

## 2020-03-21 NOTE — Telephone Encounter (Signed)
I left a detailed message on Rachel Bird's voice mail to return my call.

## 2020-05-16 ENCOUNTER — Telehealth: Payer: Self-pay | Admitting: Family Medicine

## 2020-05-16 DIAGNOSIS — R7401 Elevation of levels of liver transaminase levels: Secondary | ICD-10-CM

## 2020-05-16 NOTE — Telephone Encounter (Signed)
-----   Message from Ellamae Sia sent at 05/04/2020 12:05 PM EDT ----- Regarding: lab orders for Wednesday, 6.16.21 Lab orders for hepatic panel?

## 2020-05-17 ENCOUNTER — Other Ambulatory Visit (INDEPENDENT_AMBULATORY_CARE_PROVIDER_SITE_OTHER): Payer: 59

## 2020-05-17 DIAGNOSIS — R7401 Elevation of levels of liver transaminase levels: Secondary | ICD-10-CM | POA: Diagnosis not present

## 2020-05-17 LAB — HEPATIC FUNCTION PANEL
ALT: 15 U/L (ref 0–35)
AST: 15 U/L (ref 0–37)
Albumin: 4.6 g/dL (ref 3.5–5.2)
Alkaline Phosphatase: 70 U/L (ref 39–117)
Bilirubin, Direct: 0.2 mg/dL (ref 0.0–0.3)
Total Bilirubin: 0.5 mg/dL (ref 0.2–1.2)
Total Protein: 7.2 g/dL (ref 6.0–8.3)

## 2020-06-15 ENCOUNTER — Telehealth: Payer: Self-pay | Admitting: Physician Assistant

## 2020-06-15 NOTE — Telephone Encounter (Signed)
Patient called for an appointment.  I offered her an August appointment with Anderson Malta, but patient only wants to see [facility].  Appointment scheduled for 08/29/2020.  Patient asked if this is okay for a possible staph.

## 2020-06-15 NOTE — Telephone Encounter (Signed)
Phone call to patient to inform her that if she's worried about a possible staph infection it would be best for her to be seen sooner with whoever has an opening.  Patient agrees and she's okay with seeing Neva Seat or Dr. Denna Haggard.  Appointment scheduled next week for patient with Dr. Denna Haggard.

## 2020-06-16 ENCOUNTER — Ambulatory Visit: Payer: 59 | Admitting: Family Medicine

## 2020-06-16 ENCOUNTER — Encounter: Payer: Self-pay | Admitting: Family Medicine

## 2020-06-16 ENCOUNTER — Other Ambulatory Visit: Payer: Self-pay

## 2020-06-16 VITALS — BP 138/86 | HR 97 | Temp 97.7°F | Ht 63.0 in | Wt 155.0 lb

## 2020-06-16 DIAGNOSIS — R21 Rash and other nonspecific skin eruption: Secondary | ICD-10-CM

## 2020-06-16 MED ORDER — DOXYCYCLINE HYCLATE 100 MG PO CAPS: 100.0000 mg | ORAL_CAPSULE | Freq: Two times a day (BID) | ORAL | 0 refills | Status: DC | PRN

## 2020-06-16 MED ORDER — MUPIROCIN 2 % EX OINT: 1.0000 "application " | TOPICAL_OINTMENT | Freq: Three times a day (TID) | CUTANEOUS | 1 refills | Status: DC

## 2020-06-16 NOTE — Patient Instructions (Signed)
Good to see you today  I have sent in an oral antibiotic, doxycycline, for you to take twice a day for 7 days- may cause sun sensitivity, avoid prolonged exposure to sun, cover up- long sleeves, hat etc.   I have also sent in a prescription strength antibiotic cream for any recurrence- start to use immediately  I also recommend you use power regularly for drying

## 2020-06-16 NOTE — Progress Notes (Signed)
Subjective:    Patient ID: Rachel Bird, female    DOB: June 26, 1966, 54 y.o.   MRN: [id number]  HPI Chief Complaint  Patient presents with  . Rash    under breasts x 1-2 months. Previously tested and positive for staph   Patient is a 54 year old female who presents today with rash under her breasts.  She had similar rash several months ago and was seen by dermatology.  Per patient, the area was swabbed and she was found to have a staph infection that was not MRSA.  She was given a course of cephalexin which did not clear the rash so she contacted her dermatologist who then gave her a course of doxycycline which resulted in complete clearing of rash.  She was also given a mild potency steroid cream, alclometasone.  She reports that she has occasional heat rash in this area but it looks different and responds to use of over-the-counter powders. She thinks symptoms are exacerbated by night sweats.  Review of Systems Per HPI    Objective:   Physical Exam Vitals reviewed.  Constitutional:      General: She is not in acute distress.    Appearance: She is normal weight. She is not ill-appearing, toxic-appearing or diaphoretic.  Cardiovascular:     Rate and Rhythm: Normal rate.  Pulmonary:     Effort: Pulmonary effort is normal.  Skin:    General: Skin is warm and dry.     Findings: Rash (area under breasts with erythematous, papular, pustular rash. Not wide spread. ) present.  Neurological:     Mental Status: She is alert and oriented to person, place, and time.  Psychiatric:        Mood and Affect: Mood normal.        Behavior: Behavior normal.        Thought Content: Thought content normal.        Judgment: Judgment normal.       BP 138/86 (BP Location: Left Arm, Patient Position: Sitting, Cuff Size: Normal)   Pulse 97   Temp 97.7 F (36.5 C) (Temporal)   Ht 5\' 3"  (1.6 m)   Wt 155 lb (70.3 kg)   LMP 12/07/2015   SpO2 97%   BMI 27.46 kg/m  Wt Readings from Last 3  Encounters:  06/16/20 155 lb (70.3 kg)  02/17/20 155 lb 12 oz (70.6 kg)  10/08/19 150 lb 8 oz (68.3 kg)       Assessment & Plan:  1. Rash - Provided written and verbal information regarding diagnosis and treatment. -Sun precautions reviewed with patient while taking doxycycline - doxycycline (VIBRAMYCIN) 100 MG capsule; Take 1 capsule (100 mg total) by mouth 2 (two) times daily as needed.  Dispense: 14 capsule; Refill: 0 - mupirocin ointment (BACTROBAN) 2 %; Apply 1 application topically 3 (three) times daily. For up to 14 days.  Dispense: 22 g; Refill: 1 -Encouraged follow-up with dermatology if current symptoms do not resolve with course of antibiotic -  Patient Instructions  Good to see you today  I have sent in an oral antibiotic, doxycycline, for you to take twice a day for 7 days- may cause sun sensitivity, avoid prolonged exposure to sun, cover up- long sleeves, hat etc.   I have also sent in a prescription strength antibiotic cream for any recurrence- start to use immediately  I also recommend you use power regularly for drying      Clarene Reamer, FNP-BC  [facility]  [place]  06/16/2020 8:49 AM

## 2020-06-22 ENCOUNTER — Ambulatory Visit: Payer: 59 | Admitting: Dermatology

## 2020-07-24 ENCOUNTER — Encounter: Payer: Self-pay | Admitting: Physician Assistant

## 2020-07-24 ENCOUNTER — Ambulatory Visit: Payer: 59 | Admitting: Physician Assistant

## 2020-07-24 ENCOUNTER — Other Ambulatory Visit: Payer: Self-pay

## 2020-07-24 DIAGNOSIS — Z86018 Personal history of other benign neoplasm: Secondary | ICD-10-CM | POA: Diagnosis not present

## 2020-07-24 DIAGNOSIS — L309 Dermatitis, unspecified: Secondary | ICD-10-CM

## 2020-07-24 DIAGNOSIS — Z85828 Personal history of other malignant neoplasm of skin: Secondary | ICD-10-CM

## 2020-07-24 DIAGNOSIS — Z87898 Personal history of other specified conditions: Secondary | ICD-10-CM

## 2020-07-24 NOTE — Progress Notes (Signed)
   Follow up Visit  Subjective  Rachel Bird is a 54 y.o. female who presents for the following: Rash (under breast- + culture - 2 rounds of antibitics given per [facility]  PCP- still hasnt gone way completely tx- none). Has had a rash under breasts Kelli treated in March. Did Keflex and then Doxy. Has cleared almost totally but not 100% Moisture seems to flare it. She is wondering if she needs more antibiotic and wants to know what to do if she reflares or how to prevent a reflare.  Objective  Well appearing patient in no apparent distress; mood and affect are within normal limits.  A focused examination was performed including upper abomen. Relevant physical exam findings are noted in the Assessment and Plan.   Objective  Chest - Medial [facility]): 6 since 2006  Objective  Left Inframammary Fold, Right Inframammary Fold: No real rash noted today. Small seborrheic keratoses are palpated and visualized and the pt could feel these and thought these were where the staph started.   Assessment & Plan  History of atypical nevus Chest - Medial [facility])  History of basal cell carcinoma (BCC) (3) Right Shoulder - Anterior; Right Lower Leg - Anterior; Left Upper Back  Dermatitis (2) Left Inframammary Fold; Right Inframammary Fold  Advised the keratoses are a benign growth but could cause extra friction which could get a bacterial or fungal infection started there again. We discussed freezing them but she is going to the beach next week. She was interested in two more weeks of antibiotic but I told her she did not want Doxy before a beach trip. I also don't see any active staph there so I don't really feel it will help her. We discussed Zeasorb AF powder and the alclometasone/clotrimazole combo if she felt like she was beginning to flare. She will call with a reflare.

## 2020-08-29 ENCOUNTER — Ambulatory Visit: Payer: 59 | Admitting: Physician Assistant

## 2021-01-15 ENCOUNTER — Telehealth: Payer: Self-pay | Admitting: Family Medicine

## 2021-01-15 DIAGNOSIS — E7800 Pure hypercholesterolemia, unspecified: Secondary | ICD-10-CM

## 2021-01-15 DIAGNOSIS — E538 Deficiency of other specified B group vitamins: Secondary | ICD-10-CM

## 2021-01-15 DIAGNOSIS — E559 Vitamin D deficiency, unspecified: Secondary | ICD-10-CM

## 2021-01-15 DIAGNOSIS — D508 Other iron deficiency anemias: Secondary | ICD-10-CM

## 2021-01-15 NOTE — Telephone Encounter (Signed)
-----   Message from Cloyd Stagers, RT sent at 01/08/2021  2:03 PM EST ----- Regarding: Lab Orders for Thursday 2.24.2022 Please place lab orders for Thursday 2.24.2022, office visit for physical on Friday 3.4.2022 Thank you, Dyke Maes RT(R)

## 2021-01-25 ENCOUNTER — Other Ambulatory Visit (INDEPENDENT_AMBULATORY_CARE_PROVIDER_SITE_OTHER): Payer: 59

## 2021-01-25 ENCOUNTER — Other Ambulatory Visit: Payer: Self-pay

## 2021-01-25 DIAGNOSIS — E538 Deficiency of other specified B group vitamins: Secondary | ICD-10-CM | POA: Diagnosis not present

## 2021-01-25 DIAGNOSIS — D508 Other iron deficiency anemias: Secondary | ICD-10-CM

## 2021-01-25 DIAGNOSIS — E7800 Pure hypercholesterolemia, unspecified: Secondary | ICD-10-CM | POA: Diagnosis not present

## 2021-01-25 DIAGNOSIS — E559 Vitamin D deficiency, unspecified: Secondary | ICD-10-CM

## 2021-01-25 LAB — COMPREHENSIVE METABOLIC PANEL
ALT: 15 U/L (ref 0–35)
AST: 15 U/L (ref 0–37)
Albumin: 4.4 g/dL (ref 3.5–5.2)
Alkaline Phosphatase: 67 U/L (ref 39–117)
BUN: 14 mg/dL (ref 6–23)
CO2: 27 mEq/L (ref 19–32)
Calcium: 9.7 mg/dL (ref 8.4–10.5)
Chloride: 106 mEq/L (ref 96–112)
Creatinine, Ser: 1.05 mg/dL (ref 0.40–1.20)
GFR: 60.48 mL/min (ref >60.00)
Glucose, Bld: 94 mg/dL (ref 70–99)
Potassium: 4.2 mEq/L (ref 3.5–5.1)
Sodium: 141 mEq/L (ref 135–145)
Total Bilirubin: 0.5 mg/dL (ref 0.2–1.2)
Total Protein: 7 g/dL (ref 6.0–8.3)

## 2021-01-25 LAB — CBC WITH DIFFERENTIAL/PLATELET
Basophils Absolute: 0 10*3/uL (ref 0.0–0.1)
Basophils Relative: 0.5 % (ref 0.0–3.0)
Eosinophils Absolute: 0.1 10*3/uL (ref 0.0–0.7)
Eosinophils Relative: 1.5 % (ref 0.0–5.0)
HCT: 40.1 % (ref 36.0–46.0)
Hemoglobin: 13.2 g/dL (ref 12.0–15.0)
Lymphocytes Relative: 36 % (ref 12.0–46.0)
Lymphs Abs: 2.5 10*3/uL (ref 0.7–4.0)
MCHC: 32.9 g/dL (ref 30.0–36.0)
MCV: 88.3 fl (ref 78.0–100.0)
Monocytes Absolute: 0.4 10*3/uL (ref 0.1–1.0)
Monocytes Relative: 6.2 % (ref 3.0–12.0)
Neutro Abs: 3.9 10*3/uL (ref 1.4–7.7)
Neutrophils Relative %: 55.8 % (ref 43.0–77.0)
Platelets: 241 10*3/uL (ref 150.0–400.0)
RBC: 4.54 Mil/uL (ref 3.87–5.11)
RDW: 14.2 % (ref 11.5–15.5)
WBC: 7 10*3/uL (ref 4.0–10.5)

## 2021-01-25 LAB — VITAMIN D 25 HYDROXY (VIT D DEFICIENCY, FRACTURES): VITD: 24.6 ng/mL — ABNORMAL LOW (ref 30.00–100.00)

## 2021-01-25 LAB — LIPID PANEL
Cholesterol: 208 mg/dL — ABNORMAL HIGH (ref 0–200)
HDL: 47.3 mg/dL (ref >39.00)
LDL Cholesterol: 136 mg/dL — ABNORMAL HIGH (ref 0–99)
NonHDL: 160.94
Total CHOL/HDL Ratio: 4
Triglycerides: 123 mg/dL (ref 0.0–149.0)
VLDL: 24.6 mg/dL (ref 0.0–40.0)

## 2021-01-25 LAB — VITAMIN B12: Vitamin B-12: 581 pg/mL (ref 211–911)

## 2021-01-25 NOTE — Progress Notes (Signed)
No critical labs need to be addressed urgently. We will discuss labs in detail at upcoming office visit.   

## 2021-02-02 ENCOUNTER — Ambulatory Visit (INDEPENDENT_AMBULATORY_CARE_PROVIDER_SITE_OTHER): Payer: 59 | Admitting: Family Medicine

## 2021-02-02 ENCOUNTER — Other Ambulatory Visit: Payer: Self-pay

## 2021-02-02 ENCOUNTER — Encounter: Payer: Self-pay | Admitting: Family Medicine

## 2021-02-02 VITALS — BP 112/70 | HR 99 | Temp 97.9°F | Ht 63.0 in | Wt 156.8 lb

## 2021-02-02 DIAGNOSIS — E7800 Pure hypercholesterolemia, unspecified: Secondary | ICD-10-CM | POA: Diagnosis not present

## 2021-02-02 DIAGNOSIS — E538 Deficiency of other specified B group vitamins: Secondary | ICD-10-CM | POA: Diagnosis not present

## 2021-02-02 DIAGNOSIS — Z0000 Encounter for general adult medical examination without abnormal findings: Secondary | ICD-10-CM

## 2021-02-02 DIAGNOSIS — E559 Vitamin D deficiency, unspecified: Secondary | ICD-10-CM

## 2021-02-02 DIAGNOSIS — M7711 Lateral epicondylitis, right elbow: Secondary | ICD-10-CM

## 2021-02-02 MED ORDER — DICLOFENAC SODIUM 75 MG PO TBEC: 75.0000 mg | DELAYED_RELEASE_TABLET | Freq: Two times a day (BID) | ORAL | 0 refills | Status: DC

## 2021-02-02 NOTE — Assessment & Plan Note (Signed)
Resolved on supplement 

## 2021-02-02 NOTE — Patient Instructions (Addendum)
Vit D usupplement 1000 IU daily.  Keep up great work on healthy eating and add regular exercise. Start home physical therapy.  Start diclofenac twice daily for pain and inflammation x 1-2 weeks. If cannot swallow.. you can use Topical Voltaren gel 4 times daily.   Tennis Elbow  Tennis elbow (lateral epicondylitis) is inflammation of tendons in your outer forearm, near your elbow. Tendons are tissues that connect muscle to bone. When you have tennis elbow, inflammation affects the tendons that you use to bend your wrist and move your hand up. Inflammation occurs in the lower part of the upper arm bone (humerus), where the tendons connect to the bone (lateral epicondyle). Tennis elbow often affects people who play tennis, but anyone may get the condition from repeatedly extending the wrist or turning the forearm. What are the causes? This condition is usually caused by repeatedly extending the wrist, turning the forearm, and using the hands. It can result from sports or work that requires repetitive forearm movements. In some cases, it may be caused by a sudden injury. What increases the risk? You are more likely to develop tennis elbow if you play tennis or another racket sport. You also have a higher risk if you frequently use your hands for work. Besides people who play tennis, others at greater risk include:  People who use computers.  Architect workers.  People who work in [employer].  Musicians.  Cooks.  Cashiers. What are the signs or symptoms? Symptoms of this condition include:  Pain and tenderness in the forearm and the outer part of the elbow. Pain may be felt only when using the arm, or it may be there all the time.  A burning feeling that starts in the elbow and spreads down the forearm.  A weak grip in the hand. How is this diagnosed? This condition is diagnosed based on your symptoms, your medical history, and a physical exam. You may also have X-rays or an MRI  to:  Confirm the diagnosis.  Look for other issues.  Check for tears in the ligaments, muscles, or tendons. How is this treated? Resting and icing your arm is often the first treatment. Your health care provider may also recommend:  Medicines to reduce pain and inflammation. These may be in the form of a pill, topical gels, or shots of a steroid medicine (cortisone).  An elbow strap to reduce stress on the area.  Physical therapy. This may include massage or exercises or both.  An elbow brace to restrict the movements that cause symptoms. If these treatments do not help relieve your symptoms, your health care provider may recommend surgery to remove damaged muscle and reattach healthy muscle to bone. Follow these instructions at home: If you have a brace or strap:  Wear the brace or strap as told by your health care provider. Remove it only as told by your health care provider.  Check the skin around the brace or strap every day. Tell your health care provider about any concerns.  Loosen the brace if your fingers tingle, become numb, or turn cold and blue.  Keep the brace clean.  If the brace or strap is not waterproof: ? Do not let it get wet. ? Cover it with a watertight covering when you take a bath or a shower. Managing pain, stiffness, and swelling  If directed, put ice on the injured area. To do this: ? If you have a removable brace or strap, remove it as told by your health  care provider. ? Put ice in a plastic bag. ? Place a towel between your skin and the bag. ? Leave the ice on for 20 minutes, 2-3 times a day. ? Remove the ice if your skin turns bright red. This is very important. If you cannot feel pain, heat, or cold, you have a greater risk of damage to the area.  Move your fingers often to reduce stiffness and swelling.   Activity  Rest your elbow and wrist and avoid activities that cause symptoms as told by your health care provider.  Do physical therapy  exercises as told by your health care provider.  If you lift an object, lift it with your palm facing up. This reduces stress on your elbow. Lifestyle  If your tennis elbow is caused by sports, check your equipment and make sure that: ? You use it correctly. ? It is good match for you.  If your tennis elbow is caused by work or computer use, take frequent breaks to stretch your arm. Talk with your employer about ways to manage your condition at work. General instructions  Take over-the-counter and prescription medicines only as told by your health care provider.  Do not use any products that contain nicotine or tobacco. These products include cigarettes, chewing tobacco, and vaping devices, such as e-cigarettes. If you need help quitting, ask your health care provider.  Keep all follow-up visits. This is important. How is this prevented?  Before and after activity: ? Warm up and stretch before being active. ? Cool down and stretch after being active. ? Give your body time to rest between periods of activity.  During activity: ? Make sure to use equipment that fits you. ? If you play tennis, put power in your stroke with your lower body. Avoid using your arm only.  Maintain physical fitness, including: ? Strength. ? Flexibility. ? Endurance.  Do exercises to strengthen the forearm muscles. Contact a health care provider if:  You have pain that gets worse or does not get better with treatment.  You have numbness or weakness in your forearm, hand, or fingers. Get help right away if:  Your pain is severe.  You cannot move your wrist. Summary  Tennis elbow (lateral epicondylitis) is inflammation of tendons in your outer forearm, near your elbow.  Common symptoms include pain and tenderness in your forearm and the outer part of your elbow.  This condition is usually caused by repeatedly extending your wrist, turning your forearm, and using your hands.  The first  treatment is often resting and icing your arm to relieve symptoms. Further treatment may include taking medicine, getting physical therapy, wearing a brace or strap, or having surgery. This information is not intended to replace advice given to you by your health care provider. Make sure you discuss any questions you have with your health care provider. Document Revised: 05/30/2020 Document Reviewed: 05/30/2020 Elsevier Patient Education  [place].

## 2021-02-02 NOTE — Assessment & Plan Note (Signed)
Treat with NSAIDs, ice and home PT. Meds ordered this encounter  Medications  . diclofenac (VOLTAREN) 75 MG EC tablet    Sig: Take 1 tablet (75 mg total) by mouth 2 (two) times daily.    Dispense:  30 tablet    Refill:  0

## 2021-02-02 NOTE — Assessment & Plan Note (Signed)
Improved control and decreased CVD risk.  Encouraged exercise, weight loss, healthy eating habits.

## 2021-02-02 NOTE — Progress Notes (Signed)
Patient ID: Rachel Bird, female    DOB: [birth year], 55 y.o.   MRN: [id number]  This visit was conducted in person.  BP 112/70   Pulse 99   Temp 97.9 F (36.6 C) (Temporal)   Ht 5\' 3"  (1.6 m)   Wt 156 lb 12 oz (71.1 kg)   LMP 12/07/2015   SpO2 94%   BMI 27.77 kg/m    CC:  Chief Complaint  Patient presents with  . Annual Exam   Subjective:   HPI: Rachel Bird is a 55 y.o. female presenting on 02/02/2021 for Annual Exam   She has 1 month of right elbow pain, lateral to elbow.  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 Pain with twisting wrist. No fall.  No swelling.  has tried decreasing use, has not tried anything else.   B12 def, resolved: using once a week.   Iron def anemia, resplved  Elevated Cholesterol:  Improved control with diet changes and exercise Lab Results  Component Value Date   CHOL 208 (H) 01/25/2021   HDL 47.30 01/25/2021   LDLCALC 136 (H) 01/25/2021   LDLDIRECT 152.1 04/30/2013   TRIG 123.0 01/25/2021   CHOLHDL 4 01/25/2021  The 10-year ASCVD risk score Rachel Bird DC Jr., et al., 2013) is: 1.6%   Values used to calculate the score:     Age: 51 years     Sex: Female     Is Non-Hispanic African American: No     Diabetic: No     Tobacco smoker: No     Systolic Blood Pressure: 440 mmHg     Is BP treated: No     HDL Cholesterol: 47.3 mg/dL     Total Cholesterol: 208 mg/dL Using medications without problems: Muscle aches:  Diet compliance: low fat diet Exercise: active at work. Other complaints:  Vit d def:  24.. low  Recently restarted taking vit D supplement.     Relevant past medical, surgical, family and social history reviewed and updated as indicated. Interim medical history since our last visit reviewed. Allergies and medications reviewed and updated. Outpatient Medications Prior to Visit  Medication Sig Dispense Refill  . Methylcobalamin 1 MG CHEW Chew by mouth 2 (two) times daily.    Marland Kitchen VITAMIN D PO Take 1 tablet  by mouth daily.    Marland Kitchen doxycycline (VIBRAMYCIN) 100 MG capsule Take 1 capsule (100 mg total) by mouth 2 (two) times daily as needed. (Patient not taking: Reported on 07/24/2020) 14 capsule 0  . mupirocin ointment (BACTROBAN) 2 % Apply 1 application topically 3 (three) times daily. For up to 14 days. (Patient not taking: Reported on 07/24/2020) 22 g 1   No facility-administered medications prior to visit.     Per HPI unless specifically indicated in ROS section below Review of Systems  Constitutional: Negative for fatigue and fever.  HENT: Negative for congestion.   Eyes: Negative for pain.  Respiratory: Negative for cough and shortness of breath.   Cardiovascular: Negative for chest pain, palpitations and leg swelling.  Gastrointestinal: Negative for abdominal pain.  Genitourinary: Negative for dysuria and vaginal bleeding.  Musculoskeletal: Negative for back pain.  Neurological: Negative for syncope, light-headedness and headaches.  Psychiatric/Behavioral: Negative for dysphoric mood.   Objective:  BP 112/70   Pulse 99   Temp 97.9 F (36.6 C) (Temporal)   Ht 5\' 3"  (1.6 m)   Wt 156 lb 12 oz (71.1 kg)   LMP 12/07/2015  SpO2 94%   BMI 27.77 kg/m   Wt Readings from Last 3 Encounters:  02/02/21 156 lb 12 oz (71.1 kg)  06/16/20 155 lb (70.3 kg)  02/17/20 155 lb 12 oz (70.6 kg)      Physical Exam    Results for orders placed or performed in visit on 01/25/21  VITAMIN D 25 Hydroxy (Vit-D Deficiency, Fractures)  Result Value Ref Range   VITD 24.60 (L) 30.00 - 100.00 ng/mL  Vitamin B12  Result Value Ref Range   Vitamin B-12 581 211 - 911 pg/mL  CBC with Differential/Platelet  Result Value Ref Range   WBC 7.0 4.0 - 10.5 K/uL   RBC 4.54 3.87 - 5.11 Mil/uL   Hemoglobin 13.2 12.0 - 15.0 g/dL   HCT 40.1 36.0 - 46.0 %   MCV 88.3 78.0 - 100.0 fl   MCHC 32.9 30.0 - 36.0 g/dL   RDW 14.2 11.5 - 15.5 %   Platelets 241.0 150.0 - 400.0 K/uL   Neutrophils Relative % 55.8 43.0 - 77.0 %    Lymphocytes Relative 36.0 12.0 - 46.0 %   Monocytes Relative 6.2 3.0 - 12.0 %   Eosinophils Relative 1.5 0.0 - 5.0 %   Basophils Relative 0.5 0.0 - 3.0 %   Neutro Abs 3.9 1.4 - 7.7 K/uL   Lymphs Abs 2.5 0.7 - 4.0 K/uL   Monocytes Absolute 0.4 0.1 - 1.0 K/uL   Eosinophils Absolute 0.1 0.0 - 0.7 K/uL   Basophils Absolute 0.0 0.0 - 0.1 K/uL  Comprehensive metabolic panel  Result Value Ref Range   Sodium 141 135 - 145 mEq/L   Potassium 4.2 3.5 - 5.1 mEq/L   Chloride 106 96 - 112 mEq/L   CO2 27 19 - 32 mEq/L   Glucose, Bld 94 70 - 99 mg/dL   BUN 14 6 - 23 mg/dL   Creatinine, Ser 1.05 0.40 - 1.20 mg/dL   Total Bilirubin 0.5 0.2 - 1.2 mg/dL   Alkaline Phosphatase 67 39 - 117 U/L   AST 15 0 - 37 U/L   ALT 15 0 - 35 U/L   Total Protein 7.0 6.0 - 8.3 g/dL   Albumin 4.4 3.5 - 5.2 g/dL   GFR 60.48 >60.00 mL/min   Calcium 9.7 8.4 - 10.5 mg/dL  Lipid panel  Result Value Ref Range   Cholesterol 208 (H) 0 - 200 mg/dL   Triglycerides 123.0 0.0 - 149.0 mg/dL   HDL 47.30 >39.00 mg/dL   VLDL 24.6 0.0 - 40.0 mg/dL   LDL Cholesterol 136 (H) 0 - 99 mg/dL   Total CHOL/HDL Ratio 4    NonHDL 160.94     This visit occurred during the SARS-CoV-2 public health emergency.  Safety protocols were in place, including screening questions prior to the visit, additional usage of staff PPE, and extensive cleaning of exam room while observing appropriate contact time as indicated for disinfecting solutions.   COVID 19 screen:  No recent travel or known exposure to COVID19 The patient denies respiratory symptoms of COVID 19 at this time. The importance of social distancing was discussed today.   Assessment and Plan   The patient's preventative maintenance and recommended screening tests for an annual wellness exam were reviewed in full today. Brought up to date unless services declined.  Counselled on the importance of diet, exercise, and its role in overall health and mortality. The patient's FH and SH  was reviewed, including their home life, tobacco status, and drug and alcohol status.  Vaccines: Due for COVID booster, refuses flu andupdateTd. Mammo: 3/2020per Dr. Helane Rima PAP/DVE:  per Dr. Helane Rima nonsmoker No ETOH. Colonscopy:  2014 repeat in 10 years. Dr. Vira Agar. Problem List Items Addressed This Visit    B12 deficiency    Resolved on supplement.      High cholesterol    Improved control and decreased CVD risk.  Encouraged exercise, weight loss, healthy eating habits.       Lateral epicondylitis of right elbow    Treat with NSAIDs, ice and home PT. Meds ordered this encounter  Medications  . diclofenac (VOLTAREN) 75 MG EC tablet    Sig: Take 1 tablet (75 mg total) by mouth 2 (two) times daily.    Dispense:  30 tablet    Refill:  0         Relevant Medications   diclofenac (VOLTAREN) 75 MG EC tablet   Vitamin D deficiency    Resolved on supplement.       Other Visit Diagnoses    Routine general medical examination at a health care facility    -  Primary       Eliezer Lofts, MD

## 2021-02-28 ENCOUNTER — Ambulatory Visit: Payer: 59 | Admitting: Physician Assistant

## 2021-02-28 ENCOUNTER — Encounter: Payer: Self-pay | Admitting: Physician Assistant

## 2021-02-28 ENCOUNTER — Other Ambulatory Visit: Payer: Self-pay

## 2021-02-28 DIAGNOSIS — K130 Diseases of lips: Secondary | ICD-10-CM

## 2021-02-28 DIAGNOSIS — L918 Other hypertrophic disorders of the skin: Secondary | ICD-10-CM | POA: Diagnosis not present

## 2021-02-28 DIAGNOSIS — L304 Erythema intertrigo: Secondary | ICD-10-CM

## 2021-02-28 MED ORDER — KETOCONAZOLE 2 % EX CREA: 1.0000 "application " | TOPICAL_CREAM | Freq: Two times a day (BID) | CUTANEOUS | 10 refills | Status: AC

## 2021-02-28 MED ORDER — CEPHALEXIN 500 MG PO CAPS: ORAL_CAPSULE | ORAL | 2 refills | Status: DC

## 2021-02-28 MED ORDER — ALCLOMETASONE DIPROPIONATE 0.05 % EX CREA: TOPICAL_CREAM | Freq: Two times a day (BID) | CUTANEOUS | 3 refills | Status: DC | PRN

## 2021-02-28 MED ORDER — MUPIROCIN 2 % EX OINT: 1.0000 "application " | TOPICAL_OINTMENT | Freq: Two times a day (BID) | CUTANEOUS | 2 refills | Status: DC

## 2021-02-28 NOTE — Progress Notes (Signed)
   Follow-Up Visit   Subjective  Rachel Bird is a 55 y.o. female who presents for the following: Rash (Under breast- on and off x 1 year- tx- doxy 100mg  bid- helps).   The following portions of the chart were reviewed this encounter and updated as appropriate:  Tobacco  Allergies  Meds  Problems  Med Hx  Surg Hx  Fam Hx      Objective  Well appearing patient in no apparent distress; mood and affect are within normal limits.  All skin waist up examined.  Objective  Chest - Medial [facility]), both corners of the mouth: Small papules with erythema  Objective  Right Axilla (6): Fleshy, skin-colored sessile and pedunculated papules.     Assessment & Plan  Erythema intertrigo Chest - Medial (Center), both corners of the mouth  alclomethasone (ACLOVATE) 0.05 % cream - Chest - Medial (Center), both corners of the mouth  ketoconazole (NIZORAL) 2 % cream - Chest - Medial (Center), both corners of the mouth  cephALEXin (KEFLEX) 500 MG capsule - Chest - Medial (Center), both corners of the mouth  mupirocin ointment (BACTROBAN) 2 % - Chest - Medial (Center), both corners of the mouth  Multiple acquired skin tags irritated (6) Right Axilla  Destruction of lesion - Right Axilla Complexity: simple   Destruction method comment:  Scissors were used to snip tag at the base Informed consent: discussed and consent obtained   Timeout:  patient name, date of birth, surgical site, and procedure verified Anesthesia: the lesion was anesthetized in a standard fashion   Hemostasis achieved with:  pressure Outcome: patient tolerated procedure well with no complications   Post-procedure details: wound care instructions given      I, Vergie Zahm, PA-C, have reviewed all documentation's for this visit.  The documentation on 02/28/21 for the exam, diagnosis, procedures and orders are all accurate and complete.

## 2021-12-06 ENCOUNTER — Ambulatory Visit: Payer: BC Managed Care – PPO | Admitting: Physician Assistant

## 2021-12-06 ENCOUNTER — Other Ambulatory Visit: Payer: Self-pay

## 2021-12-06 DIAGNOSIS — Z1283 Encounter for screening for malignant neoplasm of skin: Secondary | ICD-10-CM

## 2021-12-06 DIAGNOSIS — Z85828 Personal history of other malignant neoplasm of skin: Secondary | ICD-10-CM

## 2021-12-06 DIAGNOSIS — Z86018 Personal history of other benign neoplasm: Secondary | ICD-10-CM

## 2021-12-10 ENCOUNTER — Encounter: Payer: Self-pay | Admitting: Physician Assistant

## 2021-12-10 NOTE — Progress Notes (Signed)
° °  Follow-Up Visit   Subjective  Rachel Bird is a 56 y.o. female who presents for the following: Annual Exam (Here for annual skin exam. Concerns dry area on back and small lesion on face. Ozzie Hoyle of non mole skin cancers and atypical moles. ).   The following portions of the chart were reviewed this encounter and updated as appropriate:  Tobacco   Allergies   Meds   Problems   Med Hx   Surg Hx   Fam Hx       Objective  Well appearing patient in no apparent distress; mood and affect are within normal limits.  A full examination was performed including scalp, head, eyes, ears, nose, lips, neck, chest, axillae, abdomen, back, buttocks, bilateral upper extremities, bilateral lower extremities, hands, feet, fingers, toes, fingernails, and toenails. All findings within normal limits unless otherwise noted below.  Head to toe No atypical nevi No signs of non-mole skin cancer. Dyspigmented scars clear.   Assessment & Plan  Encounter for screening for malignant neoplasm of skin Head to toe  Yearly skin exams  No atypical nevi noted at the time of the visit.  I, Kaelan Amble, PA-C, have reviewed all documentation's for this visit.  The documentation on 12/10/21 for the exam, diagnosis, procedures and orders are all accurate and complete.

## 2021-12-28 DIAGNOSIS — H5203 Hypermetropia, bilateral: Secondary | ICD-10-CM | POA: Diagnosis not present

## 2021-12-28 DIAGNOSIS — H33322 Round hole, left eye: Secondary | ICD-10-CM | POA: Diagnosis not present

## 2021-12-28 DIAGNOSIS — H52223 Regular astigmatism, bilateral: Secondary | ICD-10-CM | POA: Diagnosis not present

## 2021-12-28 DIAGNOSIS — H524 Presbyopia: Secondary | ICD-10-CM | POA: Diagnosis not present

## 2022-01-18 ENCOUNTER — Telehealth: Payer: Self-pay | Admitting: Family Medicine

## 2022-01-18 DIAGNOSIS — E538 Deficiency of other specified B group vitamins: Secondary | ICD-10-CM

## 2022-01-18 DIAGNOSIS — D508 Other iron deficiency anemias: Secondary | ICD-10-CM

## 2022-01-18 DIAGNOSIS — E7800 Pure hypercholesterolemia, unspecified: Secondary | ICD-10-CM

## 2022-01-18 DIAGNOSIS — E559 Vitamin D deficiency, unspecified: Secondary | ICD-10-CM

## 2022-01-18 NOTE — Telephone Encounter (Signed)
-----   Message from Ellamae Sia sent at 01/18/2022 10:20 AM EST ----- Regarding: Lab orders for Tuesday, 2.28.23 Patient is scheduled for CPX labs, please order future labs, Thanks , Karna Christmas

## 2022-01-18 NOTE — Addendum Note (Signed)
Addended by: Tammi Sou on: 01/18/2022 01:43 PM   Modules accepted: Orders

## 2022-01-29 ENCOUNTER — Other Ambulatory Visit (INDEPENDENT_AMBULATORY_CARE_PROVIDER_SITE_OTHER): Payer: BC Managed Care – PPO

## 2022-01-29 ENCOUNTER — Other Ambulatory Visit: Payer: Self-pay

## 2022-01-29 DIAGNOSIS — E559 Vitamin D deficiency, unspecified: Secondary | ICD-10-CM | POA: Diagnosis not present

## 2022-01-29 DIAGNOSIS — E7800 Pure hypercholesterolemia, unspecified: Secondary | ICD-10-CM

## 2022-01-29 DIAGNOSIS — D508 Other iron deficiency anemias: Secondary | ICD-10-CM

## 2022-01-29 DIAGNOSIS — E538 Deficiency of other specified B group vitamins: Secondary | ICD-10-CM

## 2022-01-29 LAB — CBC WITH DIFFERENTIAL/PLATELET
Basophils Absolute: 0 10*3/uL (ref 0.0–0.1)
Basophils Relative: 0.4 % (ref 0.0–3.0)
Eosinophils Absolute: 0.1 10*3/uL (ref 0.0–0.7)
Eosinophils Relative: 1.4 % (ref 0.0–5.0)
HCT: 39.9 % (ref 36.0–46.0)
Hemoglobin: 13.1 g/dL (ref 12.0–15.0)
Lymphocytes Relative: 37.8 % (ref 12.0–46.0)
Lymphs Abs: 2.5 10*3/uL (ref 0.7–4.0)
MCHC: 32.8 g/dL (ref 30.0–36.0)
MCV: 87.5 fl (ref 78.0–100.0)
Monocytes Absolute: 0.4 10*3/uL (ref 0.1–1.0)
Monocytes Relative: 5.6 % (ref 3.0–12.0)
Neutro Abs: 3.7 10*3/uL (ref 1.4–7.7)
Neutrophils Relative %: 54.8 % (ref 43.0–77.0)
Platelets: 231 10*3/uL (ref 150.0–400.0)
RBC: 4.57 Mil/uL (ref 3.87–5.11)
RDW: 13.9 % (ref 11.5–15.5)
WBC: 6.7 10*3/uL (ref 4.0–10.5)

## 2022-01-29 LAB — LIPID PANEL
Cholesterol: 217 mg/dL — ABNORMAL HIGH (ref 0–200)
HDL: 41.9 mg/dL (ref >39.00)
LDL Cholesterol: 152 mg/dL — ABNORMAL HIGH (ref 0–99)
NonHDL: 175.46
Total CHOL/HDL Ratio: 5
Triglycerides: 117 mg/dL (ref 0.0–149.0)
VLDL: 23.4 mg/dL (ref 0.0–40.0)

## 2022-01-29 LAB — COMPREHENSIVE METABOLIC PANEL
ALT: 14 U/L (ref 0–35)
AST: 13 U/L (ref 0–37)
Albumin: 4.5 g/dL (ref 3.5–5.2)
Alkaline Phosphatase: 60 U/L (ref 39–117)
BUN: 18 mg/dL (ref 6–23)
CO2: 25 mEq/L (ref 19–32)
Calcium: 9.2 mg/dL (ref 8.4–10.5)
Chloride: 107 mEq/L (ref 96–112)
Creatinine, Ser: 1.1 mg/dL (ref 0.40–1.20)
GFR: 56.79 mL/min — ABNORMAL LOW (ref >60.00)
Glucose, Bld: 100 mg/dL — ABNORMAL HIGH (ref 70–99)
Potassium: 4.1 mEq/L (ref 3.5–5.1)
Sodium: 140 mEq/L (ref 135–145)
Total Bilirubin: 0.4 mg/dL (ref 0.2–1.2)
Total Protein: 7.1 g/dL (ref 6.0–8.3)

## 2022-01-29 LAB — VITAMIN B12: Vitamin B-12: 890 pg/mL (ref 211–911)

## 2022-01-29 LAB — VITAMIN D 25 HYDROXY (VIT D DEFICIENCY, FRACTURES): VITD: 32.22 ng/mL (ref 30.00–100.00)

## 2022-01-29 NOTE — Progress Notes (Signed)
No critical labs need to be addressed urgently. We will discuss labs in detail at upcoming office visit.   

## 2022-02-05 ENCOUNTER — Other Ambulatory Visit: Payer: Self-pay

## 2022-02-05 ENCOUNTER — Ambulatory Visit (INDEPENDENT_AMBULATORY_CARE_PROVIDER_SITE_OTHER): Payer: BC Managed Care – PPO | Admitting: Family Medicine

## 2022-02-05 ENCOUNTER — Encounter: Payer: Self-pay | Admitting: Family Medicine

## 2022-02-05 VITALS — BP 120/80 | HR 89 | Temp 98.1°F | Ht 63.0 in | Wt 157.1 lb

## 2022-02-05 DIAGNOSIS — E559 Vitamin D deficiency, unspecified: Secondary | ICD-10-CM

## 2022-02-05 DIAGNOSIS — E538 Deficiency of other specified B group vitamins: Secondary | ICD-10-CM | POA: Diagnosis not present

## 2022-02-05 DIAGNOSIS — E7800 Pure hypercholesterolemia, unspecified: Secondary | ICD-10-CM | POA: Diagnosis not present

## 2022-02-05 DIAGNOSIS — Z0000 Encounter for general adult medical examination without abnormal findings: Secondary | ICD-10-CM | POA: Diagnosis not present

## 2022-02-05 NOTE — Assessment & Plan Note (Signed)
Stable, chronic.  Continue current medication.    

## 2022-02-05 NOTE — Progress Notes (Signed)
Patient ID: Rachel Bird, female    DOB: 1966/11/26, 56 y.o.   MRN: [id number]  This visit was conducted in person.  BP 120/80    Pulse 89    Temp 98.1 F (36.7 C) (Oral)    Ht '5\' 3"'$  (1.6 m)    Wt 157 lb 1 oz (71.2 kg)    LMP 12/07/2015    SpO2 96%    BMI 27.82 kg/m    CC:  Chief Complaint  Patient presents with   Annual Exam    Subjective:   HPI: Rachel Bird is a 56 y.o. female presenting on 02/05/2022 for Annual Exam  Elevated Cholesterol:  Diet controlled but worse lately given lunch is not as healthy as in the past. Lab Results  Component Value Date   CHOL 217 (H) 01/29/2022   HDL 41.90 01/29/2022   LDLCALC 152 (H) 01/29/2022   LDLDIRECT 152.1 04/30/2013   TRIG 117.0 01/29/2022   CHOLHDL 5 01/29/2022  The 10-year ASCVD risk score (Arnett DK, et al., 2019) is: 2.3%   Values used to calculate the score:     Age: 45 years     Sex: Female     Is Non-Hispanic African American: No     Diabetic: No     Tobacco smoker: No     Systolic Blood Pressure: 053 mmHg     Is BP treated: No     HDL Cholesterol: 41.9 mg/dL     Total Cholesterol: 217 mg/dL Using medications without problems: Muscle aches:  Diet compliance: moderate Exercise: off and on Other complaints:  Wt Readings from Last 3 Encounters:  02/05/22 157 lb 1 oz (71.2 kg)  02/02/21 156 lb 12 oz (71.1 kg)  06/16/20 155 lb (70.3 kg)   Body mass index is 27.82 kg/m.'      Vit D and B12 in nml range on supplement.  Relevant past medical, surgical, family and social history reviewed and updated as indicated. Interim medical history since our last visit reviewed. Allergies and medications reviewed and updated. Outpatient Medications Prior to Visit  Medication Sig Dispense Refill   alclomethasone (ACLOVATE) 0.05 % cream Apply topically 2 (two) times daily as needed (Rash). 180 g 3   diclofenac (VOLTAREN) 75 MG EC tablet Take 1 tablet (75 mg total) by mouth 2 (two) times daily. 30 tablet 0    ketoconazole (NIZORAL) 2 % cream ketoconazole 2 % topical cream  APPLY 1 APPLICATION TOPICALLY IN THE MORNING AND AT BEDTIME.     Methylcobalamin 1 MG CHEW Chew by mouth 2 (two) times daily.     mupirocin ointment (BACTROBAN) 2 % Apply 1 application topically 2 (two) times daily. 22 g 2   nystatin-triamcinolone ointment (MYCOLOG) nystatin-triamcinolone 100,000 unit/gram-0.1 % topical ointment  APPLY TO THE AFFECTED AREA(S) BY TOPICAL ROUTE 2 TIMES PER DAY     VITAMIN D PO Take 1 tablet by mouth daily.     No facility-administered medications prior to visit.     Per HPI unless specifically indicated in ROS section below Review of Systems  Constitutional:  Negative for fatigue and fever.  HENT:  Negative for congestion.   Eyes:  Negative for pain.  Respiratory:  Negative for cough and shortness of breath.   Cardiovascular:  Negative for chest pain, palpitations and leg swelling.  Gastrointestinal:  Negative for abdominal pain.  Genitourinary:  Negative for dysuria and vaginal bleeding.  Musculoskeletal:  Negative for back pain.  Neurological:  Negative for syncope,  light-headedness and headaches.  Psychiatric/Behavioral:  Negative for dysphoric mood.   Objective:  BP 120/80    Pulse 89    Temp 98.1 F (36.7 C) (Oral)    Ht '5\' 3"'$  (1.6 m)    Wt 157 lb 1 oz (71.2 kg)    LMP 12/07/2015    SpO2 96%    BMI 27.82 kg/m   Wt Readings from Last 3 Encounters:  02/05/22 157 lb 1 oz (71.2 kg)  02/02/21 156 lb 12 oz (71.1 kg)  06/16/20 155 lb (70.3 kg)      Physical Exam Vitals and nursing note reviewed.  Constitutional:      General: She is not in acute distress.    Appearance: Normal appearance. She is well-developed. She is not ill-appearing or toxic-appearing.  HENT:     Head: Normocephalic.     Right Ear: Hearing, tympanic membrane, ear canal and external ear normal.     Left Ear: Hearing, tympanic membrane, ear canal and external ear normal.     Nose: Nose normal.  Eyes:      General: Lids are normal. Lids are everted, no foreign bodies appreciated.     Conjunctiva/sclera: Conjunctivae normal.     Pupils: Pupils are equal, round, and reactive to light.  Neck:     Thyroid: No thyroid mass or thyromegaly.     Vascular: No carotid bruit.     Trachea: Trachea normal.  Cardiovascular:     Rate and Rhythm: Normal rate and regular rhythm.     Heart sounds: Normal heart sounds, S1 normal and S2 normal. No murmur heard.   No gallop.  Pulmonary:     Effort: Pulmonary effort is normal. No respiratory distress.     Breath sounds: Normal breath sounds. No wheezing, rhonchi or rales.  Abdominal:     General: Bowel sounds are normal. There is no distension or abdominal bruit.     Palpations: Abdomen is soft. There is no fluid wave or mass.     Tenderness: There is no abdominal tenderness. There is no guarding or rebound.     Hernia: No hernia is present.  Musculoskeletal:     Cervical back: Normal range of motion and neck supple.  Lymphadenopathy:     Cervical: No cervical adenopathy.  Skin:    General: Skin is warm and dry.     Findings: No rash.  Neurological:     Mental Status: She is alert.     Cranial Nerves: No cranial nerve deficit.     Sensory: No sensory deficit.  Psychiatric:        Mood and Affect: Mood is not anxious or depressed.        Speech: Speech normal.        Behavior: Behavior normal. Behavior is cooperative.        Judgment: Judgment normal.      Results for orders placed or performed in visit on 01/29/22  CBC with Differential/Platelet  Result Value Ref Range   WBC 6.7 4.0 - 10.5 K/uL   RBC 4.57 3.87 - 5.11 Mil/uL   Hemoglobin 13.1 12.0 - 15.0 g/dL   HCT 39.9 36.0 - 46.0 %   MCV 87.5 78.0 - 100.0 fl   MCHC 32.8 30.0 - 36.0 g/dL   RDW 13.9 11.5 - 15.5 %   Platelets 231.0 150.0 - 400.0 K/uL   Neutrophils Relative % 54.8 43.0 - 77.0 %   Lymphocytes Relative 37.8 12.0 - 46.0 %   Monocytes Relative  5.6 3.0 - 12.0 %   Eosinophils  Relative 1.4 0.0 - 5.0 %   Basophils Relative 0.4 0.0 - 3.0 %   Neutro Abs 3.7 1.4 - 7.7 K/uL   Lymphs Abs 2.5 0.7 - 4.0 K/uL   Monocytes Absolute 0.4 0.1 - 1.0 K/uL   Eosinophils Absolute 0.1 0.0 - 0.7 K/uL   Basophils Absolute 0.0 0.0 - 0.1 K/uL  VITAMIN D 25 Hydroxy (Vit-D Deficiency, Fractures)  Result Value Ref Range   VITD 32.22 30.00 - 100.00 ng/mL  Vitamin B12  Result Value Ref Range   Vitamin B-12 890 211 - 911 pg/mL  Comprehensive metabolic panel  Result Value Ref Range   Sodium 140 135 - 145 mEq/L   Potassium 4.1 3.5 - 5.1 mEq/L   Chloride 107 96 - 112 mEq/L   CO2 25 19 - 32 mEq/L   Glucose, Bld 100 (H) 70 - 99 mg/dL   BUN 18 6 - 23 mg/dL   Creatinine, Ser 1.10 0.40 - 1.20 mg/dL   Total Bilirubin 0.4 0.2 - 1.2 mg/dL   Alkaline Phosphatase 60 39 - 117 U/L   AST 13 0 - 37 U/L   ALT 14 0 - 35 U/L   Total Protein 7.1 6.0 - 8.3 g/dL   Albumin 4.5 3.5 - 5.2 g/dL   GFR 56.79 (L) >60.00 mL/min   Calcium 9.2 8.4 - 10.5 mg/dL  Lipid panel  Result Value Ref Range   Cholesterol 217 (H) 0 - 200 mg/dL   Triglycerides 117.0 0.0 - 149.0 mg/dL   HDL 41.90 >39.00 mg/dL   VLDL 23.4 0.0 - 40.0 mg/dL   LDL Cholesterol 152 (H) 0 - 99 mg/dL   Total CHOL/HDL Ratio 5    NonHDL 175.46     This visit occurred during the SARS-CoV-2 public health emergency.  Safety protocols were in place, including screening questions prior to the visit, additional usage of staff PPE, and extensive cleaning of exam room while observing appropriate contact time as indicated for disinfecting solutions.   COVID 19 screen:  No recent travel or known exposure to COVID19 The patient denies respiratory symptoms of COVID 19 at this time. The importance of social distancing was discussed today.   Assessment and Plan   The patient's preventative maintenance and recommended screening tests for an annual wellness exam were reviewed in full today. Brought up to date unless services declined.  Counselled on the  importance of diet, exercise, and its role in overall health and mortality. The patient's FH and SH was reviewed, including their home life, tobacco status, and drug and alcohol status.   Vaccines: Due for COVID booster, refuses flu and update Td. Consider shingrix  Mammo: 01/2019 per Dr. Helane Rima  PAP/DVE:  per Dr. Helane Rima  nonsmoker  No ETOH. Colonscopy:  2014 repeat in 10 years. Dr. Vira Agar.  Problem List Items Addressed This Visit     B12 deficiency    Stable, chronic.  Continue current medication.         High cholesterol    Encouraged exercise, weight loss, healthy eating habits.       Vitamin D deficiency    Stable, chronic.  Continue current medication.         Other Visit Diagnoses     Routine general medical examination at a health care facility    -  Primary       Eliezer Lofts, MD

## 2022-02-05 NOTE — Patient Instructions (Addendum)
Eat out less, less fast food. ? Get back to regular exercise.Marland Kitchen goal 150 min per week. ? Increase water intake. ? Consider shingrix vaccine. ? ? ?  ?

## 2022-02-05 NOTE — Assessment & Plan Note (Signed)
Encouraged exercise, weight loss, healthy eating habits. ? ?

## 2022-06-14 ENCOUNTER — Ambulatory Visit: Payer: BC Managed Care – PPO | Admitting: Family Medicine

## 2022-06-14 ENCOUNTER — Encounter: Payer: Self-pay | Admitting: Family Medicine

## 2022-06-14 VITALS — BP 128/84 | HR 88 | Temp 97.5°F | Ht 63.0 in | Wt 154.0 lb

## 2022-06-14 DIAGNOSIS — Z0130 Encounter for examination of blood pressure without abnormal findings: Secondary | ICD-10-CM | POA: Diagnosis not present

## 2022-06-14 DIAGNOSIS — E7800 Pure hypercholesterolemia, unspecified: Secondary | ICD-10-CM | POA: Diagnosis not present

## 2022-06-14 DIAGNOSIS — R000 Tachycardia, unspecified: Secondary | ICD-10-CM

## 2022-06-14 DIAGNOSIS — Z8249 Family history of ischemic heart disease and other diseases of the circulatory system: Secondary | ICD-10-CM

## 2022-06-14 LAB — CBC WITH DIFFERENTIAL/PLATELET
Basophils Absolute: 0 10*3/uL (ref 0.0–0.1)
Basophils Relative: 0.4 % (ref 0.0–3.0)
Eosinophils Absolute: 0.1 10*3/uL (ref 0.0–0.7)
Eosinophils Relative: 1 % (ref 0.0–5.0)
HCT: 40 % (ref 36.0–46.0)
Hemoglobin: 13.2 g/dL (ref 12.0–15.0)
Lymphocytes Relative: 36.3 % (ref 12.0–46.0)
Lymphs Abs: 2.5 10*3/uL (ref 0.7–4.0)
MCHC: 33 g/dL (ref 30.0–36.0)
MCV: 87.4 fl (ref 78.0–100.0)
Monocytes Absolute: 0.4 10*3/uL (ref 0.1–1.0)
Monocytes Relative: 5.4 % (ref 3.0–12.0)
Neutro Abs: 3.8 10*3/uL (ref 1.4–7.7)
Neutrophils Relative %: 56.9 % (ref 43.0–77.0)
Platelets: 241 10*3/uL (ref 150.0–400.0)
RBC: 4.58 Mil/uL (ref 3.87–5.11)
RDW: 14.2 % (ref 11.5–15.5)
WBC: 6.8 10*3/uL (ref 4.0–10.5)

## 2022-06-14 LAB — TSH: TSH: 1.81 u[IU]/mL (ref 0.35–5.50)

## 2022-06-14 NOTE — Assessment & Plan Note (Signed)
Chronic,  She will continue to work on low-cholesterol diet and increase exercise.  There is likely a hereditary component to her hypercholesterolemia, we can consider statin medication as an option to decrease her cardiovascular risk.

## 2022-06-14 NOTE — Progress Notes (Signed)
Patient ID: GODDESS GEBBIA, female    DOB: 1966/04/25, 56 y.o.   MRN: [id number]  This visit was conducted in person.  BP 128/84   Pulse 88   Temp (!) 97.5 F (36.4 C) (Skin)   Ht '5\' 3"'$  (1.6 m)   Wt 154 lb (69.9 kg)   LMP 12/07/2015   SpO2 96%   BMI 27.28 kg/m    CC:  Chief Complaint  Patient presents with   Follow-up    On blood pressure   Hot Flashes    Subjective:   HPI: SYMPHONI HELBLING is a 56 y.o. female presenting on 06/14/2022 for Follow-up (On blood pressure) and Hot Flashes   Hx of mitral valve prolapse, sinus tachycardia ( 2015 , has cardiac eval with Dr. Rockey Situ) BP Readings from Last 3 Encounters:  06/14/22 128/84  02/05/22 120/80  02/02/21 112/70  Using medication without problems or lightheadedness:  Chest pain with exertion: Edema: Short of breath: Average home BPs: At home with mothers BP cuff, 2 weeks ago when on vacation 94 diastolic Other issues: She has been having hot flashes... with flash she notes HR  116-120    Daily occurrence. . At rest frequently notes HR 110-120... notes at work. Associated with nausea, lightheaded, flushing.   Father with CAD, s/p CABG. 2 uncles as well with bypass. Age 27s    Body mass index is 27.28 kg/m. Wt Readings from Last 3 Encounters:  06/14/22 154 lb (69.9 kg)  02/05/22 157 lb 1 oz (71.2 kg)  02/02/21 156 lb 12 oz (71.1 kg)   Diet: healthy, lean meats, low carbs  Exercise: very active lifestyle, but no dedicated exercise  She  is nonsmoker  Reviewed past cardiology note 2015 and recent labs from 01/2022  Lab Results  Component Value Date   CHOL 217 (H) 01/29/2022   HDL 41.90 01/29/2022   LDLCALC 152 (H) 01/29/2022   LDLDIRECT 152.1 04/30/2013   TRIG 117.0 01/29/2022   CHOLHDL 5 01/29/2022   The 10-year ASCVD risk score (Arnett DK, et al., 2019) is: 2.8%   Values used to calculate the score:     Age: 59 years     Sex: Female     Is Non-Hispanic African American: No     Diabetic: No      Tobacco smoker: No     Systolic Blood Pressure: 027 mmHg     Is BP treated: No     HDL Cholesterol: 41.9 mg/dL     Total Cholesterol: 217 mg/dL        Relevant past medical, surgical, family and social history reviewed and updated as indicated. Interim medical history since our last visit reviewed. Allergies and medications reviewed and updated. Outpatient Medications Prior to Visit  Medication Sig Dispense Refill   alclomethasone (ACLOVATE) 0.05 % cream Apply topically 2 (two) times daily as needed (Rash). 180 g 3   cyanocobalamin 1000 MCG tablet Take 1,000 mcg by mouth daily.     ketoconazole (NIZORAL) 2 % cream ketoconazole 2 % topical cream  APPLY 1 APPLICATION TOPICALLY IN THE MORNING AND AT BEDTIME.     mupirocin ointment (BACTROBAN) 2 % Apply 1 application topically 2 (two) times daily. 22 g 2   nystatin-triamcinolone ointment (MYCOLOG) nystatin-triamcinolone 100,000 unit/gram-0.1 % topical ointment  APPLY TO THE AFFECTED AREA(S) BY TOPICAL ROUTE 2 TIMES PER DAY     VITAMIN D PO Take 1 tablet by mouth daily.     diclofenac (VOLTAREN) 75 MG  EC tablet Take 1 tablet (75 mg total) by mouth 2 (two) times daily. (Patient not taking: Reported on 06/14/2022) 30 tablet 0   Methylcobalamin 1 MG CHEW Chew by mouth 2 (two) times daily. (Patient not taking: Reported on 06/14/2022)     No facility-administered medications prior to visit.     Per HPI unless specifically indicated in ROS section below Review of Systems  Constitutional:  Negative for fatigue and fever.  HENT:  Negative for congestion.   Eyes:  Negative for pain.  Respiratory:  Negative for cough and shortness of breath.   Cardiovascular:  Positive for palpitations. Negative for chest pain and leg swelling.  Gastrointestinal:  Negative for abdominal pain.  Genitourinary:  Negative for dysuria and vaginal bleeding.  Musculoskeletal:  Negative for back pain.  Neurological:  Negative for syncope, light-headedness and headaches.   Psychiatric/Behavioral:  Negative for dysphoric mood.    Objective:  BP 128/84   Pulse 88   Temp (!) 97.5 F (36.4 C) (Skin)   Ht '5\' 3"'$  (1.6 m)   Wt 154 lb (69.9 kg)   LMP 12/07/2015   SpO2 96%   BMI 27.28 kg/m   Wt Readings from Last 3 Encounters:  06/14/22 154 lb (69.9 kg)  02/05/22 157 lb 1 oz (71.2 kg)  02/02/21 156 lb 12 oz (71.1 kg)      Physical Exam Constitutional:      General: She is not in acute distress.    Appearance: Normal appearance. She is well-developed. She is not ill-appearing or toxic-appearing.  HENT:     Head: Normocephalic.     Right Ear: Hearing, tympanic membrane, ear canal and external ear normal. Tympanic membrane is not erythematous, retracted or bulging.     Left Ear: Hearing, tympanic membrane, ear canal and external ear normal. Tympanic membrane is not erythematous, retracted or bulging.     Nose: No mucosal edema or rhinorrhea.     Right Sinus: No maxillary sinus tenderness or frontal sinus tenderness.     Left Sinus: No maxillary sinus tenderness or frontal sinus tenderness.     Mouth/Throat:     Pharynx: Uvula midline.  Eyes:     General: Lids are normal. Lids are everted, no foreign bodies appreciated.     Conjunctiva/sclera: Conjunctivae normal.     Pupils: Pupils are equal, round, and reactive to light.  Neck:     Thyroid: No thyroid mass or thyromegaly.     Vascular: No carotid bruit.     Trachea: Trachea normal.  Cardiovascular:     Rate and Rhythm: Normal rate and regular rhythm.     Pulses: Normal pulses.     Heart sounds: Normal heart sounds, S1 normal and S2 normal. No murmur heard.    No friction rub. No gallop.  Pulmonary:     Effort: Pulmonary effort is normal. No tachypnea or respiratory distress.     Breath sounds: Normal breath sounds. No decreased breath sounds, wheezing, rhonchi or rales.  Abdominal:     General: Bowel sounds are normal.     Palpations: Abdomen is soft.     Tenderness: There is no abdominal  tenderness.  Musculoskeletal:     Cervical back: Normal range of motion and neck supple.  Skin:    General: Skin is warm and dry.     Findings: No rash.  Neurological:     Mental Status: She is alert.  Psychiatric:        Mood and Affect: Mood is  not anxious or depressed.        Speech: Speech normal.        Behavior: Behavior normal. Behavior is cooperative.        Thought Content: Thought content normal.        Judgment: Judgment normal.       Results for orders placed or performed in visit on 01/29/22  CBC with Differential/Platelet  Result Value Ref Range   WBC 6.7 4.0 - 10.5 K/uL   RBC 4.57 3.87 - 5.11 Mil/uL   Hemoglobin 13.1 12.0 - 15.0 g/dL   HCT 39.9 36.0 - 46.0 %   MCV 87.5 78.0 - 100.0 fl   MCHC 32.8 30.0 - 36.0 g/dL   RDW 13.9 11.5 - 15.5 %   Platelets 231.0 150.0 - 400.0 K/uL   Neutrophils Relative % 54.8 43.0 - 77.0 %   Lymphocytes Relative 37.8 12.0 - 46.0 %   Monocytes Relative 5.6 3.0 - 12.0 %   Eosinophils Relative 1.4 0.0 - 5.0 %   Basophils Relative 0.4 0.0 - 3.0 %   Neutro Abs 3.7 1.4 - 7.7 K/uL   Lymphs Abs 2.5 0.7 - 4.0 K/uL   Monocytes Absolute 0.4 0.1 - 1.0 K/uL   Eosinophils Absolute 0.1 0.0 - 0.7 K/uL   Basophils Absolute 0.0 0.0 - 0.1 K/uL  VITAMIN D 25 Hydroxy (Vit-D Deficiency, Fractures)  Result Value Ref Range   VITD 32.22 30.00 - 100.00 ng/mL  Vitamin B12  Result Value Ref Range   Vitamin B-12 890 211 - 911 pg/mL  Comprehensive metabolic panel  Result Value Ref Range   Sodium 140 135 - 145 mEq/L   Potassium 4.1 3.5 - 5.1 mEq/L   Chloride 107 96 - 112 mEq/L   CO2 25 19 - 32 mEq/L   Glucose, Bld 100 (H) 70 - 99 mg/dL   BUN 18 6 - 23 mg/dL   Creatinine, Ser 1.10 0.40 - 1.20 mg/dL   Total Bilirubin 0.4 0.2 - 1.2 mg/dL   Alkaline Phosphatase 60 39 - 117 U/L   AST 13 0 - 37 U/L   ALT 14 0 - 35 U/L   Total Protein 7.1 6.0 - 8.3 g/dL   Albumin 4.5 3.5 - 5.2 g/dL   GFR 56.79 (L) >60.00 mL/min   Calcium 9.2 8.4 - 10.5 mg/dL  Lipid  panel  Result Value Ref Range   Cholesterol 217 (H) 0 - 200 mg/dL   Triglycerides 117.0 0.0 - 149.0 mg/dL   HDL 41.90 >39.00 mg/dL   VLDL 23.4 0.0 - 40.0 mg/dL   LDL Cholesterol 152 (H) 0 - 99 mg/dL   Total CHOL/HDL Ratio 5    NonHDL 175.46      COVID 19 screen:  No recent travel or known exposure to COVID19 The patient denies respiratory symptoms of COVID 19 at this time. The importance of social distancing was discussed today.   Assessment and Plan Problem List Items Addressed This Visit     Encounter for blood pressure examination    She has had 1 elevated blood pressure while on vacation.  We discussed checking her blood pressure occasionally.  Blood pressure is high normal in office today and she would likely tolerate medication for sinus Tachycardia if needed.      Relevant Orders   Ambulatory referral to Cardiology   Family history of early CAD    We will refer her to cardiology for cardiovascular disease risk evaluation given her strong family history of coronary  artery disease.      High cholesterol    Chronic,  She will continue to work on low-cholesterol diet and increase exercise.  There is likely a hereditary component to her hypercholesterolemia, we can consider statin medication as an option to decrease her cardiovascular risk.      Sinus tachycardia - Primary    Chronic, acute worsening  She is having daily episodes of short-lived palpitations associated with symptoms.  She has been diagnosed in the past by Dr. Rockey Situ with sinus tachycardia.  I will check an EKG to make sure there is no new changes.  We will evaluate for secondary causes of worsening of palpitations with lab work.  She will stop all caffeine and continue to avoid decongestants.  We discussed possible use of beta-blocker to decrease her palpitations and resting heart rate.  This may also help with potential hot flash associated symptoms and her recent trend of increasing blood pressure.       Relevant Orders   Ambulatory referral to Cardiology   CBC with Differential/Platelet   TSH   EKG 12-Lead   Other Visit Diagnoses     Family history of MI (myocardial infarction)       Relevant Orders   Ambulatory referral to Cardiology      EKG: unchanged from previous tracings, sinus tachycardia.     Eliezer Lofts, MD

## 2022-06-14 NOTE — Assessment & Plan Note (Signed)
We will refer her to cardiology for cardiovascular disease risk evaluation given her strong family history of coronary artery disease.

## 2022-06-14 NOTE — Assessment & Plan Note (Addendum)
Chronic, acute worsening  She is having daily episodes of short-lived palpitations associated with symptoms.  She has been diagnosed in the past by Dr. Rockey Situ with sinus tachycardia.  I will check an EKG to make sure there is no new changes.  We will evaluate for secondary causes of worsening of palpitations with lab work.  She will stop all caffeine and continue to avoid decongestants.  We discussed possible use of beta-blocker to decrease her palpitations and resting heart rate.  This may also help with potential hot flash associated symptoms and her recent trend of increasing blood pressure.

## 2022-06-14 NOTE — Patient Instructions (Signed)
Please stop at the lab to have labs drawn. Stop caffeine, continue to avoid decongestants. Restart cardiovascular exercise 3 to 5 days a week.  We will work on referral to cardiology for cardiovascular risk assessment.

## 2022-06-14 NOTE — Assessment & Plan Note (Signed)
She has had 1 elevated blood pressure while on vacation.  We discussed checking her blood pressure occasionally.  Blood pressure is high normal in office today and she would likely tolerate medication for sinus Tachycardia if needed.

## 2022-07-08 DIAGNOSIS — Z6826 Body mass index (BMI) 26.0-26.9, adult: Secondary | ICD-10-CM | POA: Diagnosis not present

## 2022-07-08 DIAGNOSIS — Z1231 Encounter for screening mammogram for malignant neoplasm of breast: Secondary | ICD-10-CM | POA: Diagnosis not present

## 2022-07-08 DIAGNOSIS — Z01419 Encounter for gynecological examination (general) (routine) without abnormal findings: Secondary | ICD-10-CM | POA: Diagnosis not present

## 2022-07-08 DIAGNOSIS — Z13820 Encounter for screening for osteoporosis: Secondary | ICD-10-CM | POA: Diagnosis not present

## 2022-07-08 DIAGNOSIS — Z124 Encounter for screening for malignant neoplasm of cervix: Secondary | ICD-10-CM | POA: Diagnosis not present

## 2022-07-09 LAB — HM PAP SMEAR

## 2022-08-18 NOTE — Progress Notes (Unsigned)
Cardiology Office Note  Date:  08/19/2022   ID:  Rachel Bird, DOB [birth year], MRN [id number]  PCP:  Jinny Sanders, MD   Chief Complaint  Patient presents with   New Patient (Initial Visit)    Patient c/o palpitations and rapid heart beats. Medications reviewed by the patient verbally.     HPI:  Rachel Bird is a 56 yo woman with PMH of chest pain secondary to mild gastritis,  Improved  with  Prilosec,  Seen in 01/2014 for Elevated heart rate Who presents by referral from Dr. Diona Browner for consultation of her elevated heart rate  On her visit today, discussed strong family history of coronary artery disease Father and several of his brothers with coronary disease, recent ostial LAD disease requiring CABG  She has episodes of short-lived palpitations associated with symptoms 3-4 times a month with palpitations  Active at baseline Currently not on medications for blood pressure, heart rate, cholesterol  Lipid Panel     Component Value Date/Time   CHOL 217 (H) 01/29/2022 0755   TRIG 117.0 01/29/2022 0755   HDL 41.90 01/29/2022 0755   CHOLHDL 5 01/29/2022 0755   VLDL 23.4 01/29/2022 0755   LDLCALC 152 (H) 01/29/2022 0755   LDLDIRECT 152.1 04/30/2013 1017    Previously seen in 2015 for elevated heart rate    EKG today shows NSR with rate 93 beats per minute, no significant ST or T wave changes.   PMH:   has a past medical history of Anemia, Atypical nevus (06/24/2005), BCC (basal cell carcinoma) superficial (06/24/2005), BCC (basal cell carcinoma) superficial (01/02/2007), BCC (basal cell carcinoma) superficial (01/25/2008), GERD (gastroesophageal reflux disease), Hiatal hernia, History of chicken pox, mild (09/16/2012), moderate-severe (09/16/2012), MVP (mitral valve prolapse), severe (09/16/2012), slight (06/24/2005), and slight (09/16/2012).  PSH:    Past Surgical History:  Procedure Laterality Date   UPPER GI ENDOSCOPY      Current Outpatient Medications   Medication Sig Dispense Refill   cyanocobalamin 1000 MCG tablet Take 1,000 mcg by mouth daily.     VITAMIN D PO Take 1 tablet by mouth daily.     alclomethasone (ACLOVATE) 0.05 % cream Apply topically 2 (two) times daily as needed (Rash). (Patient not taking: Reported on 08/19/2022) 180 g 3   ketoconazole (NIZORAL) 2 % cream ketoconazole 2 % topical cream  APPLY 1 APPLICATION TOPICALLY IN THE MORNING AND AT BEDTIME. (Patient not taking: Reported on 08/19/2022)     nystatin-triamcinolone ointment (MYCOLOG) nystatin-triamcinolone 100,000 unit/gram-0.1 % topical ointment  APPLY TO THE AFFECTED AREA(S) BY TOPICAL ROUTE 2 TIMES PER DAY (Patient not taking: Reported on 08/19/2022)     No current facility-administered medications for this visit.    Allergies:   Patient has no known allergies.   Social History:  The patient  reports that she has never smoked. She has never used smokeless tobacco. She reports that she does not drink alcohol and does not use drugs.   Family History:   family history includes Anxiety disorder in her daughter; Arthritis in her mother; Cancer in her maternal aunt and maternal grandmother; GER disease in her daughter; Heart disease in her paternal uncle, paternal uncle, and paternal uncle; Heart disease (age of onset: 83) in her father; Hyperlipidemia in her mother; Hyperthyroidism in her brother; Migraines in her daughter; Osteoporosis in her mother.    Review of Systems: Review of Systems  Constitutional: Negative.   HENT: Negative.    Respiratory: Negative.    Cardiovascular:  Positive  for palpitations.  Gastrointestinal: Negative.   Musculoskeletal: Negative.   Neurological: Negative.   Psychiatric/Behavioral: Negative.    All other systems reviewed and are negative.   PHYSICAL EXAM: VS:  BP (!) 150/86 (BP Location: Right Arm, Patient Position: Sitting, Cuff Size: Normal)   Pulse 93   Ht '5\' 3"'$  (1.6 m)   Wt 155 lb 8 oz (70.5 kg)   LMP 12/07/2015   SpO2 99%    BMI 27.55 kg/m  , BMI Body mass index is 27.55 kg/m. Constitutional:  oriented to person, place, and time. No distress.  HENT:  Head: Grossly normal Eyes:  no discharge. No scleral icterus.  Neck: No JVD, no carotid bruits  Cardiovascular: Regular rate and rhythm, no murmurs appreciated Pulmonary/Chest: Clear to auscultation bilaterally, no wheezes or rails Abdominal: Soft.  no distension.  no tenderness.  Musculoskeletal: Normal range of motion Neurological:  normal muscle tone. Coordination normal. No atrophy Skin: Skin warm and dry Psychiatric: normal affect, pleasant  Recent Labs: 01/29/2022: ALT 14; BUN 18; Creatinine, Ser 1.10; Potassium 4.1; Sodium 140 06/14/2022: Hemoglobin 13.2; Platelets 241.0; TSH 1.81    Lipid Panel Lab Results  Component Value Date   CHOL 217 (H) 01/29/2022   HDL 41.90 01/29/2022   LDLCALC 152 (H) 01/29/2022   TRIG 117.0 01/29/2022      Wt Readings from Last 3 Encounters:  08/19/22 155 lb 8 oz (70.5 kg)  06/14/22 154 lb (69.9 kg)  02/05/22 157 lb 1 oz (71.2 kg)     ASSESSMENT AND PLAN:  Problem List Items Addressed This Visit       Cardiology Problems   High cholesterol   Relevant Orders   EKG 12-Lead     Other   Sinus tachycardia - Primary   Relevant Orders   EKG 12-Lead   Sinus tachycardia/palpitations Rare episodes where she would appreciate palpitations, elevated heart rate Discussed work-up available including a Zio monitor and possible use of beta-blockers She will call us if she would like further evaluation  Essential hypertension Blood pressure elevated on today's visit, even on recheck Recommend close monitoring of blood pressure at home and call us with numbers Was 150/90 on today's visit If medication needed, could try medication such as bystolic which would assist with blood pressure as well as elevated heart rate/palpitations  Hyperlipidemia Strong family history coronary disease including her  father Recommended CT coronary calcium scoring for risk stratification For elevated calcium scoring, would consider initiating a statin Recent LDL 150   Total encounter time more than 60 minutes  Greater than 50% was spent in counseling and coordination of care with the patient  Patient was seen in consultation for Dr. Diona Browner and will be referred back to her office for ongoing care of the issues detailed above  Signed, Esmond Plants, M.D., Ph.D. [place], [place]

## 2022-08-19 ENCOUNTER — Encounter: Payer: Self-pay | Admitting: Cardiovascular Disease

## 2022-08-19 ENCOUNTER — Ambulatory Visit: Payer: BC Managed Care – PPO | Attending: Cardiovascular Disease | Admitting: Cardiovascular Disease

## 2022-08-19 VITALS — BP 150/90 | HR 93 | Ht 63.0 in | Wt 155.5 lb

## 2022-08-19 DIAGNOSIS — Z8249 Family history of ischemic heart disease and other diseases of the circulatory system: Secondary | ICD-10-CM | POA: Diagnosis not present

## 2022-08-19 DIAGNOSIS — I10 Essential (primary) hypertension: Secondary | ICD-10-CM

## 2022-08-19 DIAGNOSIS — R002 Palpitations: Secondary | ICD-10-CM | POA: Diagnosis not present

## 2022-08-19 DIAGNOSIS — E7800 Pure hypercholesterolemia, unspecified: Secondary | ICD-10-CM

## 2022-08-19 DIAGNOSIS — R000 Tachycardia, unspecified: Secondary | ICD-10-CM | POA: Diagnosis not present

## 2022-08-19 NOTE — Patient Instructions (Addendum)
Monitor pressures, call with numbers  Medication Instructions:  No changes  If you need a refill on your cardiac medications before your next appointment, please call your pharmacy.    Lab work: No new labs needed   Testing/Procedures: CT Coronary Calcium Score:  Your physician has recommended that you have CT Coronary Calcium Score.  - $99 out of pocket cost at the time of your test - Call [phone] to schedule at your convenience.  Location: [place] [place], [place] [address]     Follow-Up: At [facility], you and your health needs are our priority.  As part of our continuing mission to provide you with exceptional heart care, we have created designated Provider Care Teams.  These Care Teams include your primary Cardiologist (physician) and Advanced Practice Providers (APPs -  Physician Assistants and Nurse Practitioners) who all work together to provide you with the care you need, when you need it.  You will need a follow up appointment as needed  Providers on your designated Care Team:   Rachel Hodgkins, NP Rachel Faith, PA-C Rachel Bird, [place]  COVID-19 Vaccine Information can be found at: [email or web address] For questions related to vaccine distribution or appointments, please email [email or web address] or call [phone].

## 2022-08-21 ENCOUNTER — Ambulatory Visit
Admission: RE | Admit: 2022-08-21 | Discharge: 2022-08-21 | Disposition: A | Discharge status: Home / Self Care | Payer: BC Managed Care – PPO | Source: Ambulatory Visit | Attending: Cardiovascular Disease | Admitting: Cardiovascular Disease

## 2022-08-21 DIAGNOSIS — Z8249 Family history of ischemic heart disease and other diseases of the circulatory system: Secondary | ICD-10-CM | POA: Insufficient documentation

## 2022-08-21 DIAGNOSIS — E7800 Pure hypercholesterolemia, unspecified: Secondary | ICD-10-CM | POA: Insufficient documentation

## 2022-09-03 ENCOUNTER — Encounter: Payer: Self-pay | Admitting: Cardiovascular Disease

## 2023-01-21 ENCOUNTER — Encounter: Payer: Self-pay | Admitting: Family Medicine

## 2023-01-21 DIAGNOSIS — E559 Vitamin D deficiency, unspecified: Secondary | ICD-10-CM

## 2023-01-21 DIAGNOSIS — D508 Other iron deficiency anemias: Secondary | ICD-10-CM

## 2023-01-21 DIAGNOSIS — E538 Deficiency of other specified B group vitamins: Secondary | ICD-10-CM

## 2023-01-21 DIAGNOSIS — E7800 Pure hypercholesterolemia, unspecified: Secondary | ICD-10-CM

## 2023-01-21 DIAGNOSIS — Z1159 Encounter for screening for other viral diseases: Secondary | ICD-10-CM

## 2023-01-22 NOTE — Telephone Encounter (Signed)
-----   Message from [name], RT sent at 01/16/2023 11:12 AM EST ----- Regarding: Fri 3/1 lab Patient is scheduled for cpx, please order future labs.  Thanks, Anda Kraft

## 2023-01-22 NOTE — Addendum Note (Signed)
Addended by: Ellamae Sia on: 01/22/2023 02:14 PM   Modules accepted: Orders

## 2023-01-24 ENCOUNTER — Other Ambulatory Visit (INDEPENDENT_AMBULATORY_CARE_PROVIDER_SITE_OTHER): Payer: 59

## 2023-01-24 DIAGNOSIS — Z1159 Encounter for screening for other viral diseases: Secondary | ICD-10-CM

## 2023-01-24 DIAGNOSIS — E538 Deficiency of other specified B group vitamins: Secondary | ICD-10-CM

## 2023-01-24 DIAGNOSIS — E559 Vitamin D deficiency, unspecified: Secondary | ICD-10-CM | POA: Diagnosis not present

## 2023-01-24 DIAGNOSIS — D508 Other iron deficiency anemias: Secondary | ICD-10-CM | POA: Diagnosis not present

## 2023-01-24 DIAGNOSIS — E7800 Pure hypercholesterolemia, unspecified: Secondary | ICD-10-CM

## 2023-01-24 LAB — CBC WITH DIFFERENTIAL/PLATELET
Basophils Absolute: 0 10*3/uL (ref 0.0–0.1)
Basophils Relative: 0.4 % (ref 0.0–3.0)
Eosinophils Absolute: 0.1 10*3/uL (ref 0.0–0.7)
Eosinophils Relative: 1.3 % (ref 0.0–5.0)
HCT: 40.2 % (ref 36.0–46.0)
Hemoglobin: 13.5 g/dL (ref 12.0–15.0)
Lymphocytes Relative: 39.8 % (ref 12.0–46.0)
Lymphs Abs: 2.8 10*3/uL (ref 0.7–4.0)
MCHC: 33.5 g/dL (ref 30.0–36.0)
MCV: 87 fl (ref 78.0–100.0)
Monocytes Absolute: 0.4 10*3/uL (ref 0.1–1.0)
Monocytes Relative: 5.8 % (ref 3.0–12.0)
Neutro Abs: 3.7 10*3/uL (ref 1.4–7.7)
Neutrophils Relative %: 52.7 % (ref 43.0–77.0)
Platelets: 238 10*3/uL (ref 150.0–400.0)
RBC: 4.62 Mil/uL (ref 3.87–5.11)
RDW: 14.7 % (ref 11.5–15.5)
WBC: 7 10*3/uL (ref 4.0–10.5)

## 2023-01-24 LAB — VITAMIN B12: Vitamin B-12: 863 pg/mL (ref 211–911)

## 2023-01-24 LAB — COMPREHENSIVE METABOLIC PANEL
ALT: 12 U/L (ref 0–35)
AST: 12 U/L (ref 0–37)
Albumin: 4.3 g/dL (ref 3.5–5.2)
Alkaline Phosphatase: 64 U/L (ref 39–117)
BUN: 19 mg/dL (ref 6–23)
CO2: 26 mEq/L (ref 19–32)
Calcium: 9.1 mg/dL (ref 8.4–10.5)
Chloride: 108 mEq/L (ref 96–112)
Creatinine, Ser: 0.97 mg/dL (ref 0.40–1.20)
GFR: 65.58 mL/min (ref >60.00)
Glucose, Bld: 96 mg/dL (ref 70–99)
Potassium: 4.3 mEq/L (ref 3.5–5.1)
Sodium: 142 mEq/L (ref 135–145)
Total Bilirubin: 0.3 mg/dL (ref 0.2–1.2)
Total Protein: 6.4 g/dL (ref 6.0–8.3)

## 2023-01-24 LAB — IBC + FERRITIN
Ferritin: 30.3 ng/mL (ref 10.0–291.0)
Iron: 49 ug/dL (ref 42–145)
Saturation Ratios: 15 % — ABNORMAL LOW (ref 20.0–50.0)
TIBC: 327.6 ug/dL (ref 250.0–450.0)
Transferrin: 234 mg/dL (ref 212.0–360.0)

## 2023-01-24 LAB — LIPID PANEL
Cholesterol: 195 mg/dL (ref 0–200)
HDL: 44.1 mg/dL (ref >39.00)
LDL Cholesterol: 134 mg/dL — ABNORMAL HIGH (ref 0–99)
NonHDL: 151.31
Total CHOL/HDL Ratio: 4
Triglycerides: 86 mg/dL (ref 0.0–149.0)
VLDL: 17.2 mg/dL (ref 0.0–40.0)

## 2023-01-24 LAB — VITAMIN D 25 HYDROXY (VIT D DEFICIENCY, FRACTURES): VITD: 22.51 ng/mL — ABNORMAL LOW (ref 30.00–100.00)

## 2023-01-24 NOTE — Progress Notes (Signed)
No critical labs need to be addressed urgently. We will discuss labs in detail at upcoming office visit.   

## 2023-01-27 LAB — HEPATITIS C ANTIBODY: Hepatitis C Ab: NONREACTIVE

## 2023-01-27 NOTE — Progress Notes (Signed)
No critical labs need to be addressed urgently. We will discuss labs in detail at upcoming office visit.   

## 2023-01-31 ENCOUNTER — Other Ambulatory Visit: Payer: BC Managed Care – PPO

## 2023-02-07 ENCOUNTER — Encounter: Payer: Self-pay | Admitting: Family Medicine

## 2023-02-07 ENCOUNTER — Ambulatory Visit (INDEPENDENT_AMBULATORY_CARE_PROVIDER_SITE_OTHER): Payer: 59 | Admitting: Family Medicine

## 2023-02-07 VITALS — BP 129/91 | HR 96 | Temp 98.0°F | Ht 63.0 in | Wt 159.5 lb

## 2023-02-07 DIAGNOSIS — Z0000 Encounter for general adult medical examination without abnormal findings: Secondary | ICD-10-CM | POA: Diagnosis not present

## 2023-02-07 DIAGNOSIS — I10 Essential (primary) hypertension: Secondary | ICD-10-CM

## 2023-02-07 DIAGNOSIS — E7800 Pure hypercholesterolemia, unspecified: Secondary | ICD-10-CM

## 2023-02-07 DIAGNOSIS — Z1211 Encounter for screening for malignant neoplasm of colon: Secondary | ICD-10-CM

## 2023-02-07 MED ORDER — VITAMIN D3 1.25 MG (50000 UT) PO CAPS: 50000.0000 [IU] | ORAL_CAPSULE | ORAL | 0 refills | Status: DC

## 2023-02-07 MED ORDER — NEBIVOLOL HCL 5 MG PO TABS: 5.0000 mg | ORAL_TABLET | Freq: Every day | ORAL | 11 refills | Status: DC

## 2023-02-07 NOTE — Progress Notes (Signed)
Rachel Bird   Patient ID: Rachel Bird, female    DOB: Apr 27, 1966, 57 y.o.   MRN: [id number]  This visit was conducted in person.  BP (!) 129/91   Pulse 96   Temp 98 F (36.7 C) (Temporal)   Ht '5\' 3"'$  (1.6 m)   Wt 159 lb 8 oz (72.3 kg)   LMP 12/07/2015   SpO2 96%   BMI 28.25 kg/m    CC:  Chief Complaint  Patient presents with   Annual Exam    Subjective:   HPI: Rachel Bird is a 57 y.o. female presenting on 02/07/2023 for Annual Exam The patient presents for complete physical and review of chronic health problems. He/She also has the following acute concerns today: none  Reviewed labs in detail with patient.  Elevated Cholesterol:  Diet controlled, improving  She has been eating more fish lately. Lab Results  Component Value Date   CHOL 195 01/24/2023   HDL 44.10 01/24/2023   LDLCALC 134 (H) 01/24/2023   LDLDIRECT 152.1 04/30/2013   TRIG 86.0 01/24/2023   CHOLHDL 4 01/24/2023  The 10-year ASCVD risk score (Arnett DK, et al., 2019) is: 3.3%   Values used to calculate the score:     Age: 59 years     Sex: Female     Is Non-Hispanic African American: No     Diabetic: No     Tobacco smoker: No     Systolic Blood Pressure: [id number] mmHg     Is BP treated: Yes     HDL Cholesterol: 44.1 mg/dL     Total Cholesterol: 195 mg/dL Using medications without problems: Muscle aches:  Diet compliance: moderate Exercise:  2-3 days a week. Other complaints:  Wt Readings from Last 3 Encounters:  02/07/23 159 lb 8 oz (72.3 kg)  08/19/22 155 lb 8 oz (70.5 kg)  06/14/22 154 lb (69.9 kg)   Body mass index is 28.25 kg/m.'      Vit D  low   B12 in nml range on supplement.'    She has noted fluctuating BPs at home 120-80-150/90.Marland Kitchen 5 days out of the week BPs higher.  She reports some lifelong anxiety (newly recognized this morning daughter recently diagnosed with anxiety), not interested in counseling, manages on own with behavioral techniques.  Saw Dr. Rockey Situ  reviewed last  OV  08/2022 as well as phone note from October 2023 for  Has had improvement in tach/palpitation episodes but BPs continuing to be high as noted above.   Resting HR 96-110 BP Readings from Last 3 Encounters:  02/07/23 (!) 129/91  08/19/22 (!) 150/90  06/14/22 128/84     Relevant past medical, surgical, family and social history reviewed and updated as indicated. Interim medical history since our last visit reviewed. Allergies and medications reviewed and updated. Outpatient Medications Prior to Visit  Medication Sig Dispense Refill   cyanocobalamin 1000 MCG tablet Take 1,000 mcg by mouth daily.     VITAMIN D PO Take 1 tablet by mouth daily.     alclomethasone (ACLOVATE) 0.05 % cream Apply topically 2 (two) times daily as needed (Rash). (Patient not taking: Reported on 08/19/2022) 180 g 3   ketoconazole (NIZORAL) 2 % cream ketoconazole 2 % topical cream  APPLY 1 APPLICATION TOPICALLY IN THE MORNING AND AT BEDTIME. (Patient not taking: Reported on 08/19/2022)     nystatin-triamcinolone ointment (MYCOLOG) nystatin-triamcinolone 100,000 unit/gram-0.1 % topical ointment  APPLY TO THE AFFECTED AREA(S) BY TOPICAL ROUTE 2 TIMES PER DAY (Patient  not taking: Reported on 08/19/2022)     No facility-administered medications prior to visit.     Per HPI unless specifically indicated in ROS section below Review of Systems  Constitutional:  Negative for fatigue and fever.  HENT:  Negative for congestion.   Eyes:  Negative for pain.  Respiratory:  Negative for cough and shortness of breath.   Cardiovascular:  Negative for chest pain, palpitations and leg swelling.  Gastrointestinal:  Negative for abdominal pain.  Genitourinary:  Negative for dysuria and vaginal bleeding.  Musculoskeletal:  Negative for back pain.  Neurological:  Negative for syncope, light-headedness and headaches.  Psychiatric/Behavioral:  Negative for dysphoric mood.    Objective:  BP (!) 129/91   Pulse 96   Temp 98 F (36.7 C)  (Temporal)   Ht '5\' 3"'$  (1.6 m)   Wt 159 lb 8 oz (72.3 kg)   LMP 12/07/2015   SpO2 96%   BMI 28.25 kg/m   Wt Readings from Last 3 Encounters:  02/07/23 159 lb 8 oz (72.3 kg)  08/19/22 155 lb 8 oz (70.5 kg)  06/14/22 154 lb (69.9 kg)      Physical Exam Vitals and nursing note reviewed.  Constitutional:      General: She is not in acute distress.    Appearance: Normal appearance. She is well-developed. She is not ill-appearing or toxic-appearing.  HENT:     Head: Normocephalic.     Right Ear: Hearing, tympanic membrane, ear canal and external ear normal.     Left Ear: Hearing, tympanic membrane, ear canal and external ear normal.     Nose: Nose normal.  Eyes:     General: Lids are normal. Lids are everted, no foreign bodies appreciated.     Conjunctiva/sclera: Conjunctivae normal.     Pupils: Pupils are equal, round, and reactive to light.  Neck:     Thyroid: No thyroid mass or thyromegaly.     Vascular: No carotid bruit.     Trachea: Trachea normal.  Cardiovascular:     Rate and Rhythm: Normal rate and regular rhythm.     Heart sounds: Normal heart sounds, S1 normal and S2 normal. No murmur heard.    No gallop.  Pulmonary:     Effort: Pulmonary effort is normal. No respiratory distress.     Breath sounds: Normal breath sounds. No wheezing, rhonchi or rales.  Abdominal:     General: Bowel sounds are normal. There is no distension or abdominal bruit.     Palpations: Abdomen is soft. There is no fluid wave or mass.     Tenderness: There is no abdominal tenderness. There is no guarding or rebound.     Hernia: No hernia is present.  Musculoskeletal:     Cervical back: Normal range of motion and neck supple.  Lymphadenopathy:     Cervical: No cervical adenopathy.  Skin:    General: Skin is warm and dry.     Findings: No rash.  Neurological:     Mental Status: She is alert.     Cranial Nerves: No cranial nerve deficit.     Sensory: No sensory deficit.  Psychiatric:         Mood and Affect: Mood is not anxious or depressed.        Speech: Speech normal.        Behavior: Behavior normal. Behavior is cooperative.        Judgment: Judgment normal.       Results for orders placed or  performed in visit on 01/24/23  IBC + Ferritin  Result Value Ref Range   Iron 49 42 - 145 ug/dL   Transferrin 234.0 212.0 - 360.0 mg/dL   Saturation Ratios 15.0 (L) 20.0 - 50.0 %   Ferritin 30.3 10.0 - 291.0 ng/mL   TIBC 327.6 250.0 - 450.0 mcg/dL  Hepatitis C antibody  Result Value Ref Range   Hepatitis C Ab NON-REACTIVE NON-REACTIVE  VITAMIN D 25 Hydroxy (Vit-D Deficiency, Fractures)  Result Value Ref Range   VITD 22.51 (L) 30.00 - 100.00 ng/mL  Vitamin B12  Result Value Ref Range   Vitamin B-12 863 211 - 911 pg/mL  Comprehensive metabolic panel  Result Value Ref Range   Sodium 142 135 - 145 mEq/L   Potassium 4.3 3.5 - 5.1 mEq/L   Chloride 108 96 - 112 mEq/L   CO2 26 19 - 32 mEq/L   Glucose, Bld 96 70 - 99 mg/dL   BUN 19 6 - 23 mg/dL   Creatinine, Ser 0.97 0.40 - 1.20 mg/dL   Total Bilirubin 0.3 0.2 - 1.2 mg/dL   Alkaline Phosphatase 64 39 - 117 U/L   AST 12 0 - 37 U/L   ALT 12 0 - 35 U/L   Total Protein 6.4 6.0 - 8.3 g/dL   Albumin 4.3 3.5 - 5.2 g/dL   GFR 65.58 >60.00 mL/min   Calcium 9.1 8.4 - 10.5 mg/dL  Lipid panel  Result Value Ref Range   Cholesterol 195 0 - 200 mg/dL   Triglycerides 86.0 0.0 - 149.0 mg/dL   HDL 44.10 >39.00 mg/dL   VLDL 17.2 0.0 - 40.0 mg/dL   LDL Cholesterol 134 (H) 0 - 99 mg/dL   Total CHOL/HDL Ratio 4    NonHDL 151.31   CBC with Differential/Platelet  Result Value Ref Range   WBC 7.0 4.0 - 10.5 K/uL   RBC 4.62 3.87 - 5.11 Mil/uL   Hemoglobin 13.5 12.0 - 15.0 g/dL   HCT 40.2 36.0 - 46.0 %   MCV 87.0 78.0 - 100.0 fl   MCHC 33.5 30.0 - 36.0 g/dL   RDW 14.7 11.5 - 15.5 %   Platelets 238.0 150.0 - 400.0 K/uL   Neutrophils Relative % 52.7 43.0 - 77.0 %   Lymphocytes Relative 39.8 12.0 - 46.0 %   Monocytes Relative 5.8 3.0 -  12.0 %   Eosinophils Relative 1.3 0.0 - 5.0 %   Basophils Relative 0.4 0.0 - 3.0 %   Neutro Abs 3.7 1.4 - 7.7 K/uL   Lymphs Abs 2.8 0.7 - 4.0 K/uL   Monocytes Absolute 0.4 0.1 - 1.0 K/uL   Eosinophils Absolute 0.1 0.0 - 0.7 K/uL   Basophils Absolute 0.0 0.0 - 0.1 K/uL    This visit occurred during the SARS-CoV-2 public health emergency.  Safety protocols were in place, including screening questions prior to the visit, additional usage of staff PPE, and extensive cleaning of exam room while observing appropriate contact time as indicated for disinfecting solutions.   COVID 19 screen:  No recent travel or known exposure to COVID19 The patient denies respiratory symptoms of COVID 19 at this time. The importance of social distancing was discussed today.   Assessment and Plan   The patient's preventative maintenance and recommended screening tests for an annual wellness exam were reviewed in full today. Brought up to date unless services declined.  Counselled on the importance of diet, exercise, and its role in overall health and mortality. The patient's FH and [facility]  was reviewed, including their home life, tobacco status, and drug and alcohol status.   Vaccines: Due for COVID booster, refuses flu and update Td. Consider shingrix  Mammo: 01/2019 per Dr. Helane Rima.. due  PAP/DVE:  per Dr. Helane Rima  nonsmoker  No ETOH.  HIV: refused Colonscopy:  2014 repeat in 10 years. Dr. Vira Agar.  Problem List Items Addressed This Visit     High cholesterol    Chronic,  She Rachel Bird continue to work on low-cholesterol diet and increase exercise.       Relevant Medications   nebivolol (BYSTOLIC) 5 MG tablet   Primary hypertension     Start low dose Bystolic daily for  elevated BP, history of tachycardia and possible anxiety component.   Follow BP at home.Marland Kitchen goal < 140/90, more ideally 120/70.       Relevant Medications   nebivolol (BYSTOLIC) 5 MG tablet   Other Visit Diagnoses     Routine general  medical examination at a health care facility    -  Primary   Colon cancer screening       Relevant Orders   Ambulatory referral to Gastroenterology      Eliezer Lofts, MD

## 2023-02-07 NOTE — Assessment & Plan Note (Signed)
Start low dose Bystolic daily for  elevated BP, history of tachycardia and possible anxiety component.   Follow BP at home.Marland Kitchen goal < 140/90, more ideally 120/70.

## 2023-02-07 NOTE — Patient Instructions (Addendum)
Start low dose Bystolic daily for  elevated BP, history of tachycardia and possible anxiety component.  Follow BP at home.Marland Kitchen goal < 140/90, more ideally 120/70.

## 2023-02-07 NOTE — Assessment & Plan Note (Signed)
Chronic,  She will continue to work on low-cholesterol diet and increase exercise.

## 2023-02-19 ENCOUNTER — Encounter: Payer: Self-pay | Admitting: Family Medicine

## 2023-03-09 ENCOUNTER — Emergency Department: Payer: 59

## 2023-03-09 ENCOUNTER — Encounter: Payer: Self-pay | Admitting: Emergency Medicine

## 2023-03-09 ENCOUNTER — Emergency Department
Admission: EM | Admit: 2023-03-09 | Discharge: 2023-03-10 | Disposition: A | Discharge status: Home / Self Care | Payer: 59 | Attending: Emergency Medicine | Admitting: Emergency Medicine

## 2023-03-09 ENCOUNTER — Other Ambulatory Visit: Payer: Self-pay

## 2023-03-09 DIAGNOSIS — Z79899 Other long term (current) drug therapy: Secondary | ICD-10-CM | POA: Insufficient documentation

## 2023-03-09 DIAGNOSIS — R42 Dizziness and giddiness: Secondary | ICD-10-CM | POA: Diagnosis not present

## 2023-03-09 DIAGNOSIS — R55 Syncope and collapse: Secondary | ICD-10-CM | POA: Insufficient documentation

## 2023-03-09 DIAGNOSIS — I10 Essential (primary) hypertension: Secondary | ICD-10-CM | POA: Diagnosis not present

## 2023-03-09 DIAGNOSIS — D72829 Elevated white blood cell count, unspecified: Secondary | ICD-10-CM | POA: Insufficient documentation

## 2023-03-09 DIAGNOSIS — Z85828 Personal history of other malignant neoplasm of skin: Secondary | ICD-10-CM | POA: Diagnosis not present

## 2023-03-09 DIAGNOSIS — R4182 Altered mental status, unspecified: Secondary | ICD-10-CM | POA: Diagnosis not present

## 2023-03-09 LAB — BASIC METABOLIC PANEL
Anion gap: 10 (ref 5–15)
BUN: 16 mg/dL (ref 6–20)
CO2: 21 mmol/L — ABNORMAL LOW (ref 22–32)
Calcium: 8.9 mg/dL (ref 8.9–10.3)
Chloride: 107 mmol/L (ref 98–111)
Creatinine, Ser: 0.95 mg/dL (ref 0.44–1.00)
GFR, Estimated: >60 mL/min (ref >60)
Glucose, Bld: 123 mg/dL — ABNORMAL HIGH (ref 70–99)
Potassium: 3.6 mmol/L (ref 3.5–5.1)
Sodium: 138 mmol/L (ref 135–145)

## 2023-03-09 LAB — CBC
HCT: 41.9 % (ref 36.0–46.0)
Hemoglobin: 13.8 g/dL (ref 12.0–15.0)
MCH: 28.8 pg (ref 26.0–34.0)
MCHC: 32.9 g/dL (ref 30.0–36.0)
MCV: 87.3 fL (ref 80.0–100.0)
Platelets: 252 10*3/uL (ref 150–400)
RBC: 4.8 MIL/uL (ref 3.87–5.11)
RDW: 13.3 % (ref 11.5–15.5)
WBC: 12.9 10*3/uL — ABNORMAL HIGH (ref 4.0–10.5)
nRBC: 0 % (ref 0.0–0.2)

## 2023-03-09 LAB — TROPONIN I (HIGH SENSITIVITY)
Troponin I (High Sensitivity): <2 ng/L (ref <18)
Troponin I (High Sensitivity): <2 ng/L (ref <18)

## 2023-03-09 NOTE — ED Triage Notes (Signed)
Pt in via POV, reports near syncopal episode today, feeling very dizzy and dropping to knees, checked BP at that time, states it was very elevated.  States this has been an ongoing issue over last 3-4 weeks, and has recently been placed on Nebivolol 2.5mg  daily.  States she did take an additional 2.5mg  this afternoon after episode.  BP remains elevated on arrival.  Denies any chest pain, dizziness has resolved at this time, NAD noted at this time.

## 2023-03-10 NOTE — Discharge Instructions (Signed)
Your blood work EKG and CAT scans were all reassuring.  Your blood pressure and heart rate have come down.  Continue to take your medication as prescribed and check your blood pressure daily.  Please follow-up with your primary doctor.

## 2023-03-10 NOTE — ED Provider Notes (Signed)
[facility] Provider Note    Event Date/Time   First MD Initiated Contact with Patient 03/10/23 0003     (approximate)   History   Hypertension   HPI  Rachel Bird is a 57 y.o. female past medical history of mitral valve prolapse, hypertension and tachycardia who presents after an episode of presyncope.  Patient checked her blood pressure today and it was in the 140s.  She took half a dose of her nebivolol which was recently prescribed for hypertension and tachycardia.  She then was drying her hair when she felt an episode of lightheadedness lasted for about a minute.  Improved and she sat down.  Denies associated palpitations chest pain or dyspnea.  She continues to check her blood pressure and it was not coming down so took a second dose of half of nebivolol 2.5 mg.  She was concerned that the blood pressure was not continued to downtrend.  Denies chest pain shortness of breath lower extremity swelling.  No history of DVT/PE.  Currently she feels back to baseline.     Past Medical History:  Diagnosis Date   Anemia    Atypical nevus 06/24/2005   left thigh anterior   BCC (basal cell carcinoma) superficial 06/24/2005   right shin medial   BCC (basal cell carcinoma) superficial 01/02/2007   right top sholder   BCC (basal cell carcinoma) superficial 01/25/2008   left scapula   GERD (gastroesophageal reflux disease)    Hiatal hernia    History of chicken pox    mild 09/16/2012   right cheek   moderate-severe 09/16/2012   mid abdomen   MVP (mitral valve prolapse)    severe 09/16/2012   upper paraspinal   slight 06/24/2005   left outer ankle   slight 09/16/2012   mid chest    Patient Active Problem List   Diagnosis Date Noted   Primary hypertension 02/07/2023   Family history of early CAD 06/14/2022   Lateral epicondylitis of right elbow 02/02/2021   BPPV (benign paroxysmal positional vertigo) 10/08/2019   Vitamin D deficiency 04/14/2018    Encounter for blood pressure examination 02/07/2017   High cholesterol 05/03/2014   Sinus tachycardia 02/02/2014   B12 deficiency 08/26/2013   Anemia, iron deficiency 04/30/2013   MITRAL VALVE PROLAPSE 05/02/2009   ALLERGIC RHINITIS 05/02/2009   GERD 05/02/2009     Physical Exam  Triage Vital Signs: ED Triage Vitals  Enc Vitals Group     BP 03/09/23 1851 (!) 178/108     Pulse Rate 03/09/23 1851 (!) 121     Resp 03/09/23 1851 17     Temp 03/09/23 1851 98.2 F (36.8 C)     Temp Source 03/09/23 1851 Oral     SpO2 03/09/23 1851 98 %     Weight 03/09/23 1851 157 lb (71.2 kg)     Height 03/09/23 1851 5\' 3"  (1.6 m)     Head Circumference --      Peak Flow --      Pain Score 03/09/23 1850 0     Pain Loc --      Pain Edu? --      Excl. in GC? --     Most recent vital signs: Vitals:   03/09/23 1851 03/09/23 2048  BP: (!) 178/108 (!) 137/92  Pulse: (!) 121 92  Resp: 17 18  Temp: 98.2 F (36.8 C)   SpO2: 98% 95%     General: Awake, no distress.  CV:  Good peripheral perfusion.  No peripheral edema Resp:  Normal effort.  Lung sounds are clear Abd:  No distention.  Neuro:             Awake, Alert, Oriented x 3  Other:     ED Results / Procedures / Treatments  Labs (all labs ordered are listed, but only abnormal results are displayed) Labs Reviewed  BASIC METABOLIC PANEL - Abnormal; Notable for the following components:      Result Value   CO2 21 (*)    Glucose, Bld 123 (*)    All other components within normal limits  CBC - Abnormal; Notable for the following components:   WBC 12.9 (*)    All other components within normal limits  URINALYSIS, ROUTINE W REFLEX MICROSCOPIC  TROPONIN I (HIGH SENSITIVITY)  TROPONIN I (HIGH SENSITIVITY)     EKG  EKG interpretation performed by myself: sinus tach, nml axis, nml intervals, no acute ischemic changes    RADIOLOGY I reviewed and interpreted the CXR which does not show any acute cardiopulmonary  process    PROCEDURES:  Critical Care performed: No  Procedures     MEDICATIONS ORDERED IN ED: Medications - No data to display   IMPRESSION / MDM / ASSESSMENT AND PLAN / ED COURSE  I reviewed the triage vital signs and the nursing notes.                              Patient's presentation is most consistent with acute complicated illness / injury requiring diagnostic workup.  Differential diagnosis includes, but is not limited to, vasovagal episode, arrhythmia, orthostatic hypotension, low suspicion for pulmonary embolism  57 year old female with history of mitral valve prolapse recently started on nebivolol for hypertension tachycardia who presents because of presyncope.  Patient's blood pressure was elevated today.  She has not been taking the nebivolol as prescribed only as needed.  Took half of a tab but continued to have elevated blood pressure.  She then was drying her hair when she had an episode of lightheadedness without chest pain palpitations or dyspnea.  Improved upon sitting.  She took the second half a tab of nebivolol because blood pressure remained high.  On arrival she is hypertensive 178/100 however this improves without intervention to 137/92.  Heart rate initially was elevated 121 but this also improved without intervention.  EKG nonischemic showing sinus tachycardia.  Labs obtained from triage including troponin CBC and BMP are reassuring.  Does have mild leukocytosis to 13 but no infectious symptoms.  X-ray and CT head were obtained.  These are reassuring.  On my evaluation patient has normal cardiopulmonary exam and is asymptomatic.  I have low suspicion for cardiogenic cause of first presyncope today suspect either vasovagal or orthostatic.  Given she is now asymptomatic with otherwise reassuring workup I think she can safely be discharged.  Recommended she take her nebivolol as prescribed and follow-up with primary care.       FINAL CLINICAL IMPRESSION(S) /  ED DIAGNOSES   Final diagnoses:  Postural dizziness with presyncope     Rx / DC Orders   ED Discharge Orders     None        Note:  This document was prepared using Dragon voice recognition software and may include unintentional dictation errors.   Georga Hacking, MD 03/10/23 [facility]

## 2023-03-11 ENCOUNTER — Encounter: Payer: Self-pay | Admitting: Family Medicine

## 2023-03-17 ENCOUNTER — Encounter: Payer: Self-pay | Admitting: Family Medicine

## 2023-03-17 MED ORDER — NEBIVOLOL HCL 2.5 MG PO TABS: 2.5000 mg | ORAL_TABLET | Freq: Every day | ORAL | 3 refills | Status: DC

## 2023-03-27 ENCOUNTER — Encounter: Payer: Self-pay | Admitting: Internal Medicine

## 2023-04-30 ENCOUNTER — Other Ambulatory Visit: Payer: Self-pay | Admitting: Family Medicine

## 2023-04-30 NOTE — Telephone Encounter (Signed)
Last office visit 02/07/2023 for CPE.  Last refilled 02/07/23 for #12 with no refills.  Last Vit D level 01/24/23 which was low at 22.51 ng/mL.  Next Appt:  CPE 02/10/24.

## 2023-05-03 ENCOUNTER — Encounter: Payer: Self-pay | Admitting: Family Medicine

## 2023-05-09 ENCOUNTER — Other Ambulatory Visit: Payer: Self-pay | Admitting: Obstetrics and Gynecology

## 2023-05-09 DIAGNOSIS — N6310 Unspecified lump in the right breast, unspecified quadrant: Secondary | ICD-10-CM

## 2023-05-16 ENCOUNTER — Encounter: Payer: Self-pay | Admitting: Internal Medicine

## 2023-05-16 ENCOUNTER — Ambulatory Visit (AMBULATORY_SURGERY_CENTER): Payer: 59

## 2023-05-16 VITALS — Ht 63.0 in | Wt 156.0 lb

## 2023-05-16 DIAGNOSIS — Z1211 Encounter for screening for malignant neoplasm of colon: Secondary | ICD-10-CM

## 2023-05-16 MED ORDER — NA SULFATE-K SULFATE-MG SULF 17.5-3.13-1.6 GM/177ML PO SOLN: 1.0000 | Freq: Once | ORAL | 0 refills | Status: AC

## 2023-05-16 NOTE — Progress Notes (Signed)
  No egg or soy allergy known to patient;  No issues known to pt with past sedation with any surgeries or procedures; Patient denies ever being told they had issues or difficulty with intubation;  No FH of Malignant Hyperthermia; Pt is not on diet pills; Pt is not on home 02;  Pt is not on blood thinners;  Pt denies issues with constipation;  No A fib or A flutter; Have any cardiac testing pending--NO Pt instructed to use Single[employer]com or GoodRx for a price reduction on prep;   Insurance verified during PV appt=[employer]  Patient's chart reviewed by Cathlyn Parsons CNRA prior to previsit and patient appropriate for the [facility].  Previsit completed and red dot placed by patient's name on their procedure day (on provider's schedule).    Instructions printed along with GoodRx coupon and given to patient at time of PV;

## 2023-05-19 ENCOUNTER — Ambulatory Visit
Admission: RE | Admit: 2023-05-19 | Discharge: 2023-05-19 | Disposition: A | Discharge status: Home / Self Care | Payer: 59 | Source: Ambulatory Visit | Attending: Obstetrics and Gynecology | Admitting: Obstetrics and Gynecology

## 2023-05-19 ENCOUNTER — Ambulatory Visit: Payer: 59

## 2023-05-19 DIAGNOSIS — N6310 Unspecified lump in the right breast, unspecified quadrant: Secondary | ICD-10-CM

## 2023-05-27 ENCOUNTER — Ambulatory Visit
Admission: RE | Admit: 2023-05-27 | Discharge: 2023-05-27 | Disposition: A | Discharge status: Home / Self Care | Payer: 59 | Source: Ambulatory Visit | Attending: Obstetrics and Gynecology | Admitting: Obstetrics and Gynecology

## 2023-05-27 DIAGNOSIS — R922 Inconclusive mammogram: Secondary | ICD-10-CM | POA: Diagnosis not present

## 2023-05-27 DIAGNOSIS — N6310 Unspecified lump in the right breast, unspecified quadrant: Secondary | ICD-10-CM

## 2023-06-13 ENCOUNTER — Ambulatory Visit (AMBULATORY_SURGERY_CENTER): Payer: 59 | Admitting: Internal Medicine

## 2023-06-13 ENCOUNTER — Encounter: Payer: Self-pay | Admitting: Internal Medicine

## 2023-06-13 VITALS — BP 110/74 | HR 82 | Temp 98.0°F | Resp 18 | Ht 63.0 in | Wt 156.0 lb

## 2023-06-13 DIAGNOSIS — I10 Essential (primary) hypertension: Secondary | ICD-10-CM | POA: Diagnosis not present

## 2023-06-13 DIAGNOSIS — D124 Benign neoplasm of descending colon: Secondary | ICD-10-CM | POA: Diagnosis not present

## 2023-06-13 DIAGNOSIS — E785 Hyperlipidemia, unspecified: Secondary | ICD-10-CM | POA: Diagnosis not present

## 2023-06-13 DIAGNOSIS — D123 Benign neoplasm of transverse colon: Secondary | ICD-10-CM

## 2023-06-13 DIAGNOSIS — C182 Malignant neoplasm of ascending colon: Secondary | ICD-10-CM

## 2023-06-13 DIAGNOSIS — Z1211 Encounter for screening for malignant neoplasm of colon: Secondary | ICD-10-CM

## 2023-06-13 DIAGNOSIS — D122 Benign neoplasm of ascending colon: Secondary | ICD-10-CM | POA: Diagnosis not present

## 2023-06-13 MED ORDER — SODIUM CHLORIDE 0.9 % IV SOLN
500.0000 mL | Freq: Once | INTRAVENOUS | Status: DC
Start: 2023-06-13 — End: 2023-06-13

## 2023-06-13 NOTE — Progress Notes (Signed)
Called to room to assist during endoscopic procedure.  Patient ID and intended procedure confirmed with present staff. Received instructions for my participation in the procedure from the performing physician.  

## 2023-06-13 NOTE — Op Note (Signed)
[facility] Patient Name: Rachel Bird Procedure Date: 06/13/2023 9:05 AM MRN: [id number] Endoscopist: Particia Lather , , [id number] Age: 57 Referring MD:  Date of Birth: Oct 01, 1966 Gender: Female Account #: [id number] Procedure:                Colonoscopy Indications:              Screening for colorectal malignant neoplasm Medicines:                Monitored Anesthesia Care Procedure:                Pre-Anesthesia Assessment:                           - Prior to the procedure, a History and Physical                            was performed, and patient medications and                            allergies were reviewed. The patient's tolerance of                            previous anesthesia was also reviewed. The risks                            and benefits of the procedure and the sedation                            options and risks were discussed with the patient.                            All questions were answered, and informed consent                            was obtained. Prior Anticoagulants: The patient has                            taken no anticoagulant or antiplatelet agents. ASA                            Grade Assessment: II - A patient with mild systemic                            disease. After reviewing the risks and benefits,                            the patient was deemed in satisfactory condition to                            undergo the procedure.                           After obtaining informed consent, the colonoscope  was passed under direct vision. Throughout the                            procedure, the patient's blood pressure, pulse, and                            oxygen saturations were monitored continuously. The                            Olympus Scope [id number] was introduced through the                            anus and advanced to the the terminal ileum. The                             colonoscopy was performed without difficulty. The                            patient tolerated the procedure well. The quality                            of the bowel preparation was good. The terminal                            ileum, ileocecal valve, appendiceal orifice, and                            rectum were photographed. Scope In: 9:13:36 AM Scope Out: 9:29:19 AM Scope Withdrawal Time: 0 hours 14 minutes 11 seconds  Total Procedure Duration: 0 hours 15 minutes 43 seconds  Findings:                 The terminal ileum appeared normal.                           A 2 mm polyp was found in the ascending colon. The                            polyp was sessile. The polyp was removed with a                            cold biopsy forceps. Resection and retrieval were                            complete.                           Three sessile polyps were found in the descending                            colon and transverse colon. The polyps were 3 to 5                            mm in size. These polyps were removed  with a cold                            snare. Resection and retrieval were complete.                           Multiple diverticula were found in the sigmoid                            colon and descending colon.                           Non-bleeding internal hemorrhoids were found during                            retroflexion. Complications:            No immediate complications. Estimated Blood Loss:     Estimated blood loss was minimal. Impression:               - The examined portion of the ileum was normal.                           - One 2 mm polyp in the ascending colon, removed                            with a cold biopsy forceps. Resected and retrieved.                           - Three 3 to 5 mm polyps in the descending colon                            and in the transverse colon, removed with a cold                            snare. Resected and retrieved.                            - Diverticulosis in the sigmoid colon and in the                            descending colon.                           - Non-bleeding internal hemorrhoids. Recommendation:           - Discharge patient to home (with escort).                           - Await pathology results.                           - The findings and recommendations were discussed                            with the patient. Dr Particia Lather "[place]" [place],  06/13/2023  9:33:39 AM

## 2023-06-13 NOTE — Patient Instructions (Signed)
Handout on hemorrhoids, diverticulosis, and polyps given to patient. Await pathology results. Resume previous diet and continue present medications. Repeat colonoscopy for surveillance will be determined based off of pathology results.   YOU HAD AN ENDOSCOPIC PROCEDURE TODAY AT THE [facility]:   Refer to the procedure report that was given to you for any specific questions about what was found during the examination.  If the procedure report does not answer your questions, please call your gastroenterologist to clarify.  If you requested that your care partner not be given the details of your procedure findings, then the procedure report has been included in a sealed envelope for you to review at your convenience later.  YOU SHOULD EXPECT: Some feelings of bloating in the abdomen. Passage of more gas than usual.  Walking can help get rid of the air that was put into your GI tract during the procedure and reduce the bloating. If you had a lower endoscopy (such as a colonoscopy or flexible sigmoidoscopy) you may notice spotting of blood in your stool or on the toilet paper. If you underwent a bowel prep for your procedure, you may not have a normal bowel movement for a few days.  Please Note:  You might notice some irritation and congestion in your nose or some drainage.  This is from the oxygen used during your procedure.  There is no need for concern and it should clear up in a day or so.  SYMPTOMS TO REPORT IMMEDIATELY:  Following lower endoscopy (colonoscopy or flexible sigmoidoscopy):  Excessive amounts of blood in the stool  Significant tenderness or worsening of abdominal pains  Swelling of the abdomen that is new, acute  Fever of 100F or higher  For urgent or emergent issues, a gastroenterologist can be reached at any hour by calling [phone]. Do not use MyChart messaging for urgent concerns.    DIET:  We do recommend a small meal at first, but then you may proceed  to your regular diet.  Drink plenty of fluids but you should avoid alcoholic beverages for 24 hours.  ACTIVITY:  You should plan to take it easy for the rest of today and you should NOT DRIVE or use heavy machinery until tomorrow (because of the sedation medicines used during the test).    FOLLOW UP: Our staff will call the number listed on your records the next business day following your procedure.  We will call around 7:15- 8:00 am to check on you and address any questions or concerns that you may have regarding the information given to you following your procedure. If we do not reach you, we will leave a message.     If any biopsies were taken you will be contacted by phone or by letter within the next 1-3 weeks.  Please call us at [phone] if you have not heard about the biopsies in 3 weeks.    SIGNATURES/CONFIDENTIALITY: You and/or your care partner have signed paperwork which will be entered into your electronic medical record.  These signatures attest to the fact that that the information above on your After Visit Summary has been reviewed and is understood.  Full responsibility of the confidentiality of this discharge information lies with you and/or your care-partner. 

## 2023-06-13 NOTE — Progress Notes (Signed)
GASTROENTEROLOGY PROCEDURE H&P NOTE   Primary Care Physician: Excell Seltzer, MD    Reason for Procedure:   Colon cancer screening  Plan:    Colonoscopy  Patient is appropriate for endoscopic procedure(s) in the ambulatory ([facility]) setting.  The nature of the procedure, as well as the risks, benefits, and alternatives were carefully and thoroughly reviewed with the patient. Ample time for discussion and questions allowed. The patient understood, was satisfied, and agreed to proceed.     HPI: Rachel Bird is a 57 y.o. female who presents for colonoscopy for colon cancer screening. Denies blood in stools, changes in bowel habits, or unintentional weight loss. Denies family history of colon cancer. Father had colon polyps. Last colonoscopy in 2014 showed internal hemorrhoids and small diverticula in the sigmoid colon.   Past Medical History:  Diagnosis Date   Anemia    hx of   Atypical nevus 06/24/2005   left thigh anterior   BCC (basal cell carcinoma) superficial 06/24/2005   right shin medial   BCC (basal cell carcinoma) superficial 01/02/2007   right top sholder   BCC (basal cell carcinoma) superficial 01/25/2008   left scapula   GERD (gastroesophageal reflux disease)    hx of   Hiatal hernia 2014   History of chicken pox    Hyperlipidemia    diet controlled   Hypertension    on meds   mild 09/16/2012   right cheek   moderate-severe 09/16/2012   mid abdomen   MVP (mitral valve prolapse)    Osteopenia    Seasonal allergies    severe 09/16/2012   upper paraspinal   slight 06/24/2005   left outer ankle   slight 09/16/2012   mid chest    Past Surgical History:  Procedure Laterality Date   CERVICAL ABLATION  2020   COLONOSCOPY  2014   in [place]-Dr. Elliott-along with EGD -- [facility]   UPPER GI ENDOSCOPY     WISDOM TOOTH EXTRACTION      Prior to Admission medications   Medication Sig Start Date End Date Taking? Authorizing Provider  nebivolol  (BYSTOLIC) 2.5 MG tablet Take 1 tablet (2.5 mg total) by mouth daily. 03/17/23  Yes Bedsole, Amy E, MD  Cholecalciferol (VITAMIN D3) 1.25 MG (50000 UT) CAPS TAKE 1 CAPSULE BY MOUTH ONE TIME PER WEEK Patient not taking: Reported on 06/13/2023 04/30/23   Excell Seltzer, MD  cyanocobalamin 1000 MCG tablet Take 1,000 mcg by mouth daily. Patient not taking: Reported on 05/16/2023    [provider]  VITAMIN D PO Take 1 tablet by mouth daily. Patient not taking: Reported on 05/16/2023    [provider]    Current Outpatient Medications  Medication Sig Dispense Refill   nebivolol (BYSTOLIC) 2.5 MG tablet Take 1 tablet (2.5 mg total) by mouth daily. 90 tablet 3   Cholecalciferol (VITAMIN D3) 1.25 MG (50000 UT) CAPS TAKE 1 CAPSULE BY MOUTH ONE TIME PER WEEK (Patient not taking: Reported on 06/13/2023) 12 capsule 0   cyanocobalamin 1000 MCG tablet Take 1,000 mcg by mouth daily. (Patient not taking: Reported on 05/16/2023)     VITAMIN D PO Take 1 tablet by mouth daily. (Patient not taking: Reported on 05/16/2023)     Current Facility-Administered Medications  Medication Dose Route Frequency Provider Last Rate Last Admin   0.9 %  sodium chloride infusion  500 mL Intravenous Once Imogene Burn, MD        Allergies as of 06/13/2023   (  No Known Allergies)    Family History  Problem Relation Age of Onset   Hyperlipidemia Mother    Osteoporosis Mother    Arthritis Mother    Hypertension Mother    Colon polyps Father 42   Heart disease Father 70       CABG   Hyperlipidemia Father    Hyperthyroidism Brother    Cancer Maternal Aunt        colon cancer and female cancer   Heart disease Paternal Uncle        CABG   Heart disease Paternal Uncle        stent   Heart disease Paternal Uncle        stent   Cancer Maternal Grandmother        colon and female cancer?   Anxiety disorder Daughter    GER disease Daughter    Migraines Daughter    Colon cancer Neg Hx    Esophageal cancer  Neg Hx    Stomach cancer Neg Hx    Rectal cancer Neg Hx     Social History   Socioeconomic History   Marital status: Married    Spouse name: Samaura Clowdus   Number of children: 1   Years of education: Not on file   Highest education level: Not on file  Occupational History   Occupation: management  Tobacco Use   Smoking status: Never   Smokeless tobacco: Never  Vaping Use   Vaping status: Never Used  Substance and Sexual Activity   Alcohol use: Never   Drug use: Never   Sexual activity: Yes    Partners: Male    Birth control/protection: Post-menopausal    Comment: husband with vasectomy  Other Topics Concern   Not on file  Social History Narrative   Not on file   Social Determinants of Health   Financial Resource Strain: Not on file  Food Insecurity: Not on file  Transportation Needs: Not on file  Physical Activity: Not on file  Stress: Not on file  Social Connections: Not on file  Intimate Partner Violence: Not on file    Physical Exam: Vital signs in last 24 hours: BP 128/74   Pulse 88   Temp 98 F (36.7 C) (Temporal)   Ht 5\' 3"  (1.6 m)   Wt 156 lb (70.8 kg)   LMP 12/07/2015   SpO2 98%   BMI 27.63 kg/m  GEN: NAD EYE: Sclerae anicteric ENT: MMM CV: Non-tachycardic Pulm: No increased work of breathing GI: Soft, NT/ND NEURO:  Alert & Oriented   Eulah Pont, MD [facility]  06/13/2023 8:40 AM

## 2023-06-13 NOTE — Progress Notes (Signed)
Pt's states no medical or surgical changes since previsit or office visit. 

## 2023-06-13 NOTE — Progress Notes (Signed)
A and O x3. Report to RN. Tolerated MAC anesthesia well. 

## 2023-06-16 ENCOUNTER — Telehealth: Payer: Self-pay

## 2023-06-16 NOTE — Telephone Encounter (Signed)
  Follow up Call-     06/13/2023    8:19 AM  Call back number  Post procedure Call Back phone  # [phone]  Permission to leave phone message Yes     Patient questions:  Do you have a fever, pain , or abdominal swelling? No. Pain Score  0 *  Have you tolerated food without any problems? Yes.    Have you been able to return to your normal activities? Yes.    Do you have any questions about your discharge instructions: Diet   No. Medications  No. Follow up visit  No.  Do you have questions or concerns about your Care? No.  Actions: * If pain score is 4 or above: No action needed, pain <4.

## 2023-06-18 ENCOUNTER — Encounter: Payer: Self-pay | Admitting: Internal Medicine

## 2023-07-22 DIAGNOSIS — Z1231 Encounter for screening mammogram for malignant neoplasm of breast: Secondary | ICD-10-CM | POA: Diagnosis not present

## 2023-07-22 DIAGNOSIS — Z6828 Body mass index (BMI) 28.0-28.9, adult: Secondary | ICD-10-CM | POA: Diagnosis not present

## 2023-07-22 DIAGNOSIS — Z01419 Encounter for gynecological examination (general) (routine) without abnormal findings: Secondary | ICD-10-CM | POA: Diagnosis not present

## 2023-07-22 LAB — HM MAMMOGRAPHY

## 2023-08-19 ENCOUNTER — Encounter: Payer: Self-pay | Admitting: Cardiovascular Disease

## 2023-08-19 ENCOUNTER — Ambulatory Visit: Payer: 59 | Attending: Cardiovascular Disease | Admitting: Cardiovascular Disease

## 2023-08-19 VITALS — BP 120/82 | HR 78 | Ht 63.0 in | Wt 159.4 lb

## 2023-08-19 DIAGNOSIS — R002 Palpitations: Secondary | ICD-10-CM | POA: Diagnosis not present

## 2023-08-19 DIAGNOSIS — I10 Essential (primary) hypertension: Secondary | ICD-10-CM | POA: Diagnosis not present

## 2023-08-19 DIAGNOSIS — E7800 Pure hypercholesterolemia, unspecified: Secondary | ICD-10-CM

## 2023-08-19 NOTE — Progress Notes (Signed)
Cardiology Office Note  Date:  08/19/2023   ID:  Rachel Bird, DOB Oct 12, 1966, MRN [id number]  PCP:  Excell Seltzer, MD   Chief Complaint  Patient presents with   Follow-up    Patient c/o low BP, bystolic started by PCP, wants to discuss BP     HPI:  Rachel Bird is a 57 yo woman with PMH of chest pain secondary to mild gastritis,  Improved  with  Prilosec,  Seen in 01/2014 for Elevated heart rate Calcium score 0 in 9/23 Hyperlipidemia Who presents for f/u of her elevated heart rate  LOV 9/23 In follow-up today overall reports doing well Continues to work at [employer], has been there over 30 years  Starting to do more exercise, active at baseline though works long hours Has rowing machine  Heart rate better on bystolic 2.5 daily Dose was reduced from 5 mg for low blood pressure Sometimes in the morning, most notable on the weekends will have low blood pressure 100 systolic  Denies significant shortness of breath or chest pain on exertion No significant lower extremity edema  Other past medical history reviewed strong family history of coronary artery disease Father and several of his brothers with coronary disease, ostial LAD disease requiring CABG  Lipid Panel     Component Value Date/Time   CHOL 195 01/24/2023 0750   TRIG 86.0 01/24/2023 0750   HDL 44.10 01/24/2023 0750   CHOLHDL 4 01/24/2023 0750   VLDL 17.2 01/24/2023 0750   LDLCALC 134 (H) 01/24/2023 0750   LDLDIRECT 152.1 04/30/2013 1017    Previously seen in 2015 for elevated heart rate    EKG today shows NSR with rate 93 beats per minute, no significant ST or T wave changes.   PMH:   has a past medical history of Anemia, Atypical nevus (06/24/2005), BCC (basal cell carcinoma) superficial (06/24/2005), BCC (basal cell carcinoma) superficial (01/02/2007), BCC (basal cell carcinoma) superficial (01/25/2008), GERD (gastroesophageal reflux disease), Hiatal hernia (2014), History of chicken pox,  Hyperlipidemia, Hypertension, mild (09/16/2012), moderate-severe (09/16/2012), MVP (mitral valve prolapse), Osteopenia, Seasonal allergies, severe (09/16/2012), slight (06/24/2005), and slight (09/16/2012).  PSH:    Past Surgical History:  Procedure Laterality Date   CERVICAL ABLATION  2020   COLONOSCOPY  2014   in [place]-Dr. Elliott-along with EGD -- HH   UPPER GI ENDOSCOPY     WISDOM TOOTH EXTRACTION      Current Outpatient Medications  Medication Sig Dispense Refill   Cholecalciferol (VITAMIN D3) 1.25 MG (50000 UT) CAPS TAKE 1 CAPSULE BY MOUTH ONE TIME PER WEEK 12 capsule 0   nebivolol (BYSTOLIC) 2.5 MG tablet Take 1 tablet (2.5 mg total) by mouth daily. 90 tablet 3   cyanocobalamin 1000 MCG tablet Take 1,000 mcg by mouth daily. (Patient not taking: Reported on 05/16/2023)     VITAMIN D PO Take 1 tablet by mouth daily. (Patient not taking: Reported on 08/19/2023)     No current facility-administered medications for this visit.    Allergies:   Patient has no known allergies.   Social History:  The patient  reports that she has never smoked. She has never used smokeless tobacco. She reports that she does not drink alcohol and does not use drugs.   Family History:   family history includes Anxiety disorder in her daughter; Arthritis in her mother; Cancer in her maternal aunt and maternal grandmother; Colon polyps (age of onset: 34) in her father; GER disease in her daughter; Heart disease  in her paternal uncle, paternal uncle, and paternal uncle; Heart disease (age of onset: 67) in her father; Hyperlipidemia in her father and mother; Hypertension in her mother; Hyperthyroidism in her brother; Migraines in her daughter; Osteoporosis in her mother.    Review of Systems: Review of Systems  Constitutional: Negative.   HENT: Negative.    Respiratory: Negative.    Cardiovascular:  Positive for palpitations.  Gastrointestinal: Negative.   Musculoskeletal: Negative.    Neurological: Negative.   Psychiatric/Behavioral: Negative.    All other systems reviewed and are negative.   PHYSICAL EXAM: VS:  BP 120/82 (BP Location: Left Arm, Patient Position: Sitting, Cuff Size: Normal)   Pulse 78   Ht 5\' 3"  (1.6 m)   Wt 159 lb 6.4 oz (72.3 kg)   LMP 12/07/2015   SpO2 98%   BMI 28.24 kg/m  , BMI Body mass index is 28.24 kg/m. Constitutional:  oriented to person, place, and time. No distress.  HENT:  Head: Grossly normal Eyes:  no discharge. No scleral icterus.  Neck: No JVD, no carotid bruits  Cardiovascular: Regular rate and rhythm, no murmurs appreciated Pulmonary/Chest: Clear to auscultation bilaterally, no wheezes or rails Abdominal: Soft.  no distension.  no tenderness.  Musculoskeletal: Normal range of motion Neurological:  normal muscle tone. Coordination normal. No atrophy Skin: Skin warm and dry Psychiatric: normal affect, pleasant  Recent Labs: 01/24/2023: ALT 12 03/09/2023: BUN 16; Creatinine, Ser 0.95; Hemoglobin 13.8; Platelets 252; Potassium 3.6; Sodium 138    Lipid Panel Lab Results  Component Value Date   CHOL 195 01/24/2023   HDL 44.10 01/24/2023   LDLCALC 134 (H) 01/24/2023   TRIG 86.0 01/24/2023      Wt Readings from Last 3 Encounters:  08/19/23 159 lb 6.4 oz (72.3 kg)  06/13/23 156 lb (70.8 kg)  05/16/23 156 lb (70.8 kg)     ASSESSMENT AND PLAN:  Problem List Items Addressed This Visit       Cardiology Problems   High cholesterol   Primary hypertension - Primary   Other Visit Diagnoses     Palpitations           Sinus tachycardia/palpitations Symptoms relatively well-controlled on low-dose beta-blocker, bystolic 2.5 mg daily Potentially could take the medication later in the morning after breakfast and hydrating Especially on mornings with low blood pressure For persistent low blood pressure on weekends, could consider holding the bystolic Saturday/Sunday  Essential hypertension Blood pressure is well  controlled on today's visit. No changes made to the medications. Elevated blood pressure earlier in the year associated with vertigo, resolved without intervention  Hyperlipidemia Strong family history coronary disease including her father Calcium score of 0 Total cholesterol improved less than 200   Total encounter time more than 30 minutes  Greater than 50% was spent in counseling and coordination of care with the patient   Signed, Dossie Arbour, M.D., Ph.D. [facility] [place], [place] [phone]

## 2023-08-19 NOTE — Patient Instructions (Signed)

## 2023-09-02 DIAGNOSIS — H33322 Round hole, left eye: Secondary | ICD-10-CM | POA: Diagnosis not present

## 2023-09-02 DIAGNOSIS — H3563 Retinal hemorrhage, bilateral: Secondary | ICD-10-CM | POA: Diagnosis not present

## 2023-12-19 ENCOUNTER — Ambulatory Visit: Payer: 59 | Admitting: Family Medicine

## 2023-12-19 VITALS — BP 132/80 | HR 122 | Temp 102.2°F | Ht 63.0 in | Wt 161.2 lb

## 2023-12-19 DIAGNOSIS — R000 Tachycardia, unspecified: Secondary | ICD-10-CM

## 2023-12-19 DIAGNOSIS — J0100 Acute maxillary sinusitis, unspecified: Secondary | ICD-10-CM

## 2023-12-19 DIAGNOSIS — R509 Fever, unspecified: Secondary | ICD-10-CM | POA: Diagnosis not present

## 2023-12-19 LAB — POC INFLUENZA A&B (BINAX/QUICKVUE)
Influenza A, POC: NEGATIVE
Influenza B, POC: NEGATIVE

## 2023-12-19 MED ORDER — AMOXICILLIN-POT CLAVULANATE 875-125 MG PO TABS
1.0000 | ORAL_TABLET | Freq: Two times a day (BID) | ORAL | 0 refills | Status: DC
Start: 2023-12-19 — End: 2024-02-10

## 2023-12-19 MED ORDER — IBUPROFEN 200 MG PO TABS
800.0000 mg | ORAL_TABLET | Freq: Once | ORAL | Status: AC
Start: 2023-12-19 — End: 2023-12-19
  Administered 2023-12-19: 800 mg via ORAL

## 2023-12-19 NOTE — Patient Instructions (Signed)
Push fluids.  Complete antibiotics.  Ibuprofen 800 mg three times daily for face pain and fever.  Go to ER if unable to keep down liquids.

## 2023-12-19 NOTE — Progress Notes (Signed)
Patient ID: Rachel Bird, female    DOB: [birth year], 58 y.o.   MRN: [id number]  This visit was conducted in person.  BP 132/80 (BP Location: Left Arm, Patient Position: Sitting, Cuff Size: Normal)   Pulse (!) 122   Temp (!) 102.2 F (39 C) (Temporal)   Ht 5\' 3"  (1.6 m)   Wt 161 lb 4 oz (73.1 kg)   LMP 12/07/2015   SpO2 96%   BMI 28.56 kg/m    CC:  Chief Complaint  Patient presents with   Facial Pain    Had Covid 2 weeks ago   Ear Fullness    Congested   Fever   Cough    Subjective:   HPI: Rachel Bird is a 58 y.o. female presenting on 12/19/2023 for Facial Pain (Had Covid 2 weeks ago), Ear Fullness (Congested), Fever, and Cough   Augmentin Date of onset:  2 weeks  had COVID. Initial symptoms included  headache, head congestion fever, fatgiue  Felt better until last 24 hours.. now fever 102 F today Symptoms progressed to scratchy throat, ears full, face pain and pressure.  Now new cough, dry  NO SOB, no wheeze.   Sick contacts:  COVID COVID testing:   none     She has tried to treat with  nothing in particular.     No history of chronic lung disease such as asthma or COPD. Non-smoker. Hx of sinus infection   Has history of sinus infections.. this feel like that. .      Relevant past medical, surgical, family and social history reviewed and updated as indicated. Interim medical history since our last visit reviewed. Allergies and medications reviewed and updated. Outpatient Medications Prior to Visit  Medication Sig Dispense Refill   nebivolol (BYSTOLIC) 2.5 MG tablet Take 1 tablet (2.5 mg total) by mouth daily. 90 tablet 3   Cholecalciferol (VITAMIN D3) 1.25 MG (50000 UT) CAPS TAKE 1 CAPSULE BY MOUTH ONE TIME PER WEEK 12 capsule 0   cyanocobalamin 1000 MCG tablet Take 1,000 mcg by mouth daily. (Patient not taking: Reported on 05/16/2023)     VITAMIN D PO Take 1 tablet by mouth daily. (Patient not taking: Reported on 08/19/2023)     No  facility-administered medications prior to visit.     Per HPI unless specifically indicated in ROS section below Review of Systems  Constitutional:  Positive for diaphoresis and fever. Negative for fatigue.  HENT:  Positive for sinus pain. Negative for congestion.   Eyes:  Negative for pain.  Respiratory:  Positive for cough. Negative for shortness of breath.   Cardiovascular:  Negative for chest pain, palpitations and leg swelling.  Gastrointestinal:  Negative for abdominal pain.  Genitourinary:  Negative for dysuria and vaginal bleeding.  Musculoskeletal:  Negative for back pain.  Neurological:  Negative for syncope, light-headedness and headaches.  Psychiatric/Behavioral:  Negative for dysphoric mood.    Objective:  BP 132/80 (BP Location: Left Arm, Patient Position: Sitting, Cuff Size: Normal)   Pulse (!) 122   Temp (!) 102.2 F (39 C) (Temporal)   Ht 5\' 3"  (1.6 m)   Wt 161 lb 4 oz (73.1 kg)   LMP 12/07/2015   SpO2 96%   BMI 28.56 kg/m   Wt Readings from Last 3 Encounters:  12/19/23 161 lb 4 oz (73.1 kg)  08/19/23 159 lb 6.4 oz (72.3 kg)  06/13/23 156 lb (70.8 kg)      Physical Exam Constitutional:  General: She is not in acute distress.    Appearance: Normal appearance. She is well-developed. She is not ill-appearing or toxic-appearing.  HENT:     Head: Normocephalic.     Right Ear: Hearing, tympanic membrane, ear canal and external ear normal. Tympanic membrane is not erythematous, retracted or bulging.     Left Ear: Hearing, tympanic membrane, ear canal and external ear normal. Tympanic membrane is not erythematous, retracted or bulging.     Nose: No mucosal edema or rhinorrhea.     Right Turbinates: Swollen.     Left Turbinates: Swollen.     Right Sinus: Maxillary sinus tenderness and frontal sinus tenderness present.     Left Sinus: Maxillary sinus tenderness and frontal sinus tenderness present.     Mouth/Throat:     Pharynx: Uvula midline.  Eyes:      General: Lids are normal. Lids are everted, no foreign bodies appreciated.     Conjunctiva/sclera: Conjunctivae normal.     Pupils: Pupils are equal, round, and reactive to light.  Neck:     Thyroid: No thyroid mass or thyromegaly.     Vascular: No carotid bruit.     Trachea: Trachea normal.  Cardiovascular:     Rate and Rhythm: Regular rhythm. Tachycardia present.     Pulses: Normal pulses.     Heart sounds: Normal heart sounds, S1 normal and S2 normal. No murmur heard.    No systolic murmur is present.     No diastolic murmur is present.     No friction rub. No gallop.  Pulmonary:     Effort: Pulmonary effort is normal. No tachypnea or respiratory distress.     Breath sounds: Normal breath sounds. No decreased breath sounds, wheezing, rhonchi or rales.  Abdominal:     General: Bowel sounds are normal.     Palpations: Abdomen is soft.     Tenderness: There is no abdominal tenderness.  Musculoskeletal:     Cervical back: Normal range of motion and neck supple.  Skin:    General: Skin is warm and dry.     Findings: No rash.  Neurological:     Mental Status: She is alert.  Psychiatric:        Mood and Affect: Mood is not anxious or depressed.        Speech: Speech normal.        Behavior: Behavior normal. Behavior is cooperative.        Thought Content: Thought content normal.        Judgment: Judgment normal.       Results for orders placed or performed in visit on 12/19/23  POC Influenza A&B (Binax test)   Collection Time: 12/19/23 12:34 PM  Result Value Ref Range   Influenza A, POC Negative Negative   Influenza B, POC Negative Negative    Assessment and Plan  Acute non-recurrent maxillary sinusitis Assessment & Plan: Acute, initial COVID infection, resolved now with new fever and face pain. Influenza test in office today negative. Lungs clear to auscultation and minimal lower respiratory tract symptoms.  Most concerning for bacterial superinfection in sinuses.  No  otitis media noted.  Will treat with Augmentin 875/125 mg p.o. twice daily x 10 days.  Start nasal saline irrigation.  Patient given ibuprofen 800 mg x 1 in office for fever control. She will continue this every 8 hours as needed for fever and face pain.     Fever, unspecified fever cause -     POC  Influenza A&B(BINAX/QUICKVUE) -     Ibuprofen  Tachycardia Assessment & Plan: Acute, most likely secondary to fever.  Patient states she is tolerating oral liquids well and is pushing fluids. Ibuprofen 800 mg p.o. x 1 given.  She will follow heart rate and temperature.  ER and return precautions provided   Other orders -     Amoxicillin-Pot Clavulanate; Take 1 tablet by mouth 2 (two) times daily.  Dispense: 20 tablet; Refill: 0    No follow-ups on file.   Kerby Nora, MD

## 2023-12-19 NOTE — Assessment & Plan Note (Signed)
Acute, most likely secondary to fever.  Patient states she is tolerating oral liquids well and is pushing fluids. Ibuprofen 800 mg p.o. x 1 given.  She will follow heart rate and temperature.  ER and return precautions provided

## 2023-12-19 NOTE — Assessment & Plan Note (Signed)
Acute, initial COVID infection, resolved now with new fever and face pain. Influenza test in office today negative. Lungs clear to auscultation and minimal lower respiratory tract symptoms.  Most concerning for bacterial superinfection in sinuses.  No otitis media noted.  Will treat with Augmentin 875/125 mg p.o. twice daily x 10 days.  Start nasal saline irrigation.  Patient given ibuprofen 800 mg x 1 in office for fever control. She will continue this every 8 hours as needed for fever and face pain.

## 2023-12-31 ENCOUNTER — Encounter: Payer: Self-pay | Admitting: Family Medicine

## 2024-01-02 MED ORDER — BENZONATATE 200 MG PO CAPS: 200.0000 mg | ORAL_CAPSULE | Freq: Two times a day (BID) | ORAL | 0 refills | Status: DC | PRN

## 2024-01-26 ENCOUNTER — Telehealth: Payer: Self-pay | Admitting: *Deleted

## 2024-01-26 DIAGNOSIS — D508 Other iron deficiency anemias: Secondary | ICD-10-CM

## 2024-01-26 DIAGNOSIS — E538 Deficiency of other specified B group vitamins: Secondary | ICD-10-CM

## 2024-01-26 DIAGNOSIS — E559 Vitamin D deficiency, unspecified: Secondary | ICD-10-CM

## 2024-01-26 DIAGNOSIS — E7800 Pure hypercholesterolemia, unspecified: Secondary | ICD-10-CM

## 2024-01-26 NOTE — Telephone Encounter (Signed)
-----   Message from Alvina Chou sent at 01/26/2024  9:05 AM EST ----- Regarding: Lab orders for TUE, 3.4.25 Patient is scheduled for CPX labs, please order future labs, Thanks , Camelia Eng

## 2024-02-03 ENCOUNTER — Encounter: Payer: Self-pay | Admitting: Family Medicine

## 2024-02-03 ENCOUNTER — Other Ambulatory Visit (INDEPENDENT_AMBULATORY_CARE_PROVIDER_SITE_OTHER): Payer: 59

## 2024-02-03 DIAGNOSIS — E559 Vitamin D deficiency, unspecified: Secondary | ICD-10-CM | POA: Diagnosis not present

## 2024-02-03 DIAGNOSIS — E538 Deficiency of other specified B group vitamins: Secondary | ICD-10-CM

## 2024-02-03 DIAGNOSIS — D508 Other iron deficiency anemias: Secondary | ICD-10-CM

## 2024-02-03 DIAGNOSIS — E7800 Pure hypercholesterolemia, unspecified: Secondary | ICD-10-CM

## 2024-02-03 LAB — CBC WITH DIFFERENTIAL/PLATELET
Basophils Absolute: 0 10*3/uL (ref 0.0–0.1)
Basophils Relative: 0.4 % (ref 0.0–3.0)
Eosinophils Absolute: 0.1 10*3/uL (ref 0.0–0.7)
Eosinophils Relative: 1.9 % (ref 0.0–5.0)
HCT: 40.9 % (ref 36.0–46.0)
Hemoglobin: 13.6 g/dL (ref 12.0–15.0)
Lymphocytes Relative: 39.6 % (ref 12.0–46.0)
Lymphs Abs: 2.9 10*3/uL (ref 0.7–4.0)
MCHC: 33.4 g/dL (ref 30.0–36.0)
MCV: 89.6 fl (ref 78.0–100.0)
Monocytes Absolute: 0.5 10*3/uL (ref 0.1–1.0)
Monocytes Relative: 6.2 % (ref 3.0–12.0)
Neutro Abs: 3.8 10*3/uL (ref 1.4–7.7)
Neutrophils Relative %: 51.9 % (ref 43.0–77.0)
Platelets: 250 10*3/uL (ref 150.0–400.0)
RBC: 4.56 Mil/uL (ref 3.87–5.11)
RDW: 14.5 % (ref 11.5–15.5)
WBC: 7.3 10*3/uL (ref 4.0–10.5)

## 2024-02-03 LAB — COMPREHENSIVE METABOLIC PANEL
ALT: 16 U/L (ref 0–35)
AST: 15 U/L (ref 0–37)
Albumin: 4.5 g/dL (ref 3.5–5.2)
Alkaline Phosphatase: 63 U/L (ref 39–117)
BUN: 18 mg/dL (ref 6–23)
CO2: 28 meq/L (ref 19–32)
Calcium: 9.9 mg/dL (ref 8.4–10.5)
Chloride: 106 meq/L (ref 96–112)
Creatinine, Ser: 1.04 mg/dL (ref 0.40–1.20)
GFR: 59.89 mL/min — ABNORMAL LOW (ref >60.00)
Glucose, Bld: 103 mg/dL — ABNORMAL HIGH (ref 70–99)
Potassium: 4.6 meq/L (ref 3.5–5.1)
Sodium: 141 meq/L (ref 135–145)
Total Bilirubin: 0.5 mg/dL (ref 0.2–1.2)
Total Protein: 6.9 g/dL (ref 6.0–8.3)

## 2024-02-03 LAB — LIPID PANEL
Cholesterol: 229 mg/dL — ABNORMAL HIGH (ref 0–200)
HDL: 44.6 mg/dL (ref >39.00)
LDL Cholesterol: 159 mg/dL — ABNORMAL HIGH (ref 0–99)
NonHDL: 184.18
Total CHOL/HDL Ratio: 5
Triglycerides: 127 mg/dL (ref 0.0–149.0)
VLDL: 25.4 mg/dL (ref 0.0–40.0)

## 2024-02-03 LAB — IBC + FERRITIN
Ferritin: 52.5 ng/mL (ref 10.0–291.0)
Iron: 93 ug/dL (ref 42–145)
Saturation Ratios: 27.7 % (ref 20.0–50.0)
TIBC: 336 ug/dL (ref 250.0–450.0)
Transferrin: 240 mg/dL (ref 212.0–360.0)

## 2024-02-03 LAB — VITAMIN B12: Vitamin B-12: 259 pg/mL (ref 211–911)

## 2024-02-03 LAB — VITAMIN D 25 HYDROXY (VIT D DEFICIENCY, FRACTURES): VITD: 33.8 ng/mL (ref 30.00–100.00)

## 2024-02-03 NOTE — Progress Notes (Signed)
 No critical labs need to be addressed urgently. We will discuss labs in detail at upcoming office visit.

## 2024-02-10 ENCOUNTER — Ambulatory Visit (INDEPENDENT_AMBULATORY_CARE_PROVIDER_SITE_OTHER): Payer: 59 | Admitting: Family Medicine

## 2024-02-10 ENCOUNTER — Encounter: Payer: Self-pay | Admitting: Family Medicine

## 2024-02-10 VITALS — BP 102/70 | HR 87 | Temp 98.0°F | Ht 62.75 in | Wt 157.5 lb

## 2024-02-10 DIAGNOSIS — Z0000 Encounter for general adult medical examination without abnormal findings: Secondary | ICD-10-CM | POA: Diagnosis not present

## 2024-02-10 DIAGNOSIS — I10 Essential (primary) hypertension: Secondary | ICD-10-CM

## 2024-02-10 DIAGNOSIS — E559 Vitamin D deficiency, unspecified: Secondary | ICD-10-CM

## 2024-02-10 DIAGNOSIS — E538 Deficiency of other specified B group vitamins: Secondary | ICD-10-CM

## 2024-02-10 DIAGNOSIS — D508 Other iron deficiency anemias: Secondary | ICD-10-CM

## 2024-02-10 DIAGNOSIS — E7800 Pure hypercholesterolemia, unspecified: Secondary | ICD-10-CM

## 2024-02-10 DIAGNOSIS — R7303 Prediabetes: Secondary | ICD-10-CM | POA: Diagnosis not present

## 2024-02-10 NOTE — Assessment & Plan Note (Signed)
 Chronic, good control    Bystolic 2.5 mg  daily for  elevated BP, history of tachycardia and possible anxiety component.

## 2024-02-10 NOTE — Progress Notes (Signed)
 Will   Patient ID: Rachel Bird, female    DOB: 03-Apr-1966, 58 y.o.   MRN: [id number]  This visit was conducted in person.  BP 102/70 (BP Location: Left Arm, Patient Position: Sitting, Cuff Size: Normal)   Pulse 87   Temp 98 F (36.7 C) (Temporal)   Ht 5' 2.75" (1.594 m)   Wt 157 lb 8 oz (71.4 kg)   LMP 12/07/2015   SpO2 98%   BMI 28.12 kg/m    CC:  Chief Complaint  Patient presents with   Annual Exam    Subjective:   HPI: Rachel Bird is a 58 y.o. female presenting on 02/10/2024 for Annual Exam The patient presents for complete physical and review of chronic health problems. He/She also has the following acute concerns today: none  Reviewed labs in detail with patient.  Hypertension:  Good control on 2.5 mg Bystolic daily BP Readings from Last 3 Encounters:  02/10/24 102/70  12/19/23 132/80  08/19/23 120/82  Using medication without problems or lightheadedness:  none Chest pain with exertion: none Edema: none Short of breath: none Average home BPs: Other issues: no recent spells of tachy.Marland Kitchen occ palpitations only 1-2 times per month.  Elevated Cholesterol:  worsened control in last year   Lab Results  Component Value Date   CHOL 229 (H) 02/03/2024   HDL 44.60 02/03/2024   LDLCALC 159 (H) 02/03/2024   LDLDIRECT 152.1 04/30/2013   TRIG 127.0 02/03/2024   CHOLHDL 5 02/03/2024  The 10-year ASCVD risk score (Arnett DK, et al., 2019) is: 2.6%   Values used to calculate the score:     Age: 48 years     Sex: Female     Is Non-Hispanic African American: No     Diabetic: No     Tobacco smoker: No     Systolic Blood Pressure: 102 mmHg     Is BP treated: Yes     HDL Cholesterol: 44.6 mg/dL     Total Cholesterol: 229 mg/dL Using medications without problems: Muscle aches:  Diet compliance: moderate Exercise:  none, plans to restart Other complaints:  Wt Readings from Last 3 Encounters:  02/10/24 157 lb 8 oz (71.4 kg)  12/19/23 161 lb 4 oz (73.1 kg)   08/19/23 159 lb 6.4 oz (72.3 kg)   Body mass index is 28.12 kg/m.'      Vit D  defr resolved,   B12 in nml range on supplement. No anemia, nml iron panel.    She has noted fluctuating BPs at home 120-80-150/90.Marland Kitchen 5 days out of the week BPs higher.  She reports some lifelong anxiety (newly recognized this morning daughter recently diagnosed with anxiety), not interested in counseling, manages on own with behavioral techniques.  Saw Dr. Mariah Milling  reviewed last  OV 08/2022 as well as phone note from October 2023 for  Has had improvement in tach/palpitation episodes but BPs continuing to be high as noted above.   Resting HR 96-110 BP Readings from Last 3 Encounters:  02/10/24 102/70  12/19/23 132/80  08/19/23 120/82     Relevant past medical, surgical, family and social history reviewed and updated as indicated. Interim medical history since our last visit reviewed. Allergies and medications reviewed and updated. Outpatient Medications Prior to Visit  Medication Sig Dispense Refill   erythromycin with ethanol (EMGEL) 2 % gel Apply topically 2 (two) times daily.     nebivolol (BYSTOLIC) 2.5 MG tablet Take 1 tablet (2.5 mg total) by mouth daily. 90  tablet 3   pimecrolimus (ELIDEL) 1 % cream Apply 1 Application topically 2 (two) times daily.     amoxicillin-clavulanate (AUGMENTIN) 875-125 MG tablet Take 1 tablet by mouth 2 (two) times daily. 20 tablet 0   benzonatate (TESSALON) 200 MG capsule Take 1 capsule (200 mg total) by mouth 2 (two) times daily as needed for cough. 20 capsule 0   No facility-administered medications prior to visit.     Per HPI unless specifically indicated in ROS section below Review of Systems  Constitutional:  Negative for fatigue and fever.  HENT:  Negative for congestion.   Eyes:  Negative for pain.  Respiratory:  Negative for cough and shortness of breath.   Cardiovascular:  Negative for chest pain, palpitations and leg swelling.  Gastrointestinal:   Negative for abdominal pain.  Genitourinary:  Negative for dysuria and vaginal bleeding.  Musculoskeletal:  Negative for back pain.  Neurological:  Negative for syncope, light-headedness and headaches.  Psychiatric/Behavioral:  Negative for dysphoric mood.    Objective:  BP 102/70 (BP Location: Left Arm, Patient Position: Sitting, Cuff Size: Normal)   Pulse 87   Temp 98 F (36.7 C) (Temporal)   Ht 5' 2.75" (1.594 m)   Wt 157 lb 8 oz (71.4 kg)   LMP 12/07/2015   SpO2 98%   BMI 28.12 kg/m   Wt Readings from Last 3 Encounters:  02/10/24 157 lb 8 oz (71.4 kg)  12/19/23 161 lb 4 oz (73.1 kg)  08/19/23 159 lb 6.4 oz (72.3 kg)      Physical Exam Vitals and nursing note reviewed.  Constitutional:      General: She is not in acute distress.    Appearance: Normal appearance. She is well-developed. She is not ill-appearing or toxic-appearing.  HENT:     Head: Normocephalic.     Right Ear: Hearing, tympanic membrane, ear canal and external ear normal.     Left Ear: Hearing, tympanic membrane, ear canal and external ear normal.     Nose: Nose normal.  Eyes:     General: Lids are normal. Lids are everted, no foreign bodies appreciated.     Conjunctiva/sclera: Conjunctivae normal.     Pupils: Pupils are equal, round, and reactive to light.  Neck:     Thyroid: No thyroid mass or thyromegaly.     Vascular: No carotid bruit.     Trachea: Trachea normal.  Cardiovascular:     Rate and Rhythm: Normal rate and regular rhythm.     Heart sounds: Normal heart sounds, S1 normal and S2 normal. No murmur heard.    No gallop.  Pulmonary:     Effort: Pulmonary effort is normal. No respiratory distress.     Breath sounds: Normal breath sounds. No wheezing, rhonchi or rales.  Abdominal:     General: Bowel sounds are normal. There is no distension or abdominal bruit.     Palpations: Abdomen is soft. There is no fluid wave or mass.     Tenderness: There is no abdominal tenderness. There is no  guarding or rebound.     Hernia: No hernia is present.  Musculoskeletal:     Cervical back: Normal range of motion and neck supple.  Lymphadenopathy:     Cervical: No cervical adenopathy.  Skin:    General: Skin is warm and dry.     Findings: No rash.  Neurological:     Mental Status: She is alert.     Cranial Nerves: No cranial nerve deficit.  Sensory: No sensory deficit.  Psychiatric:        Mood and Affect: Mood is not anxious or depressed.        Speech: Speech normal.        Behavior: Behavior normal. Behavior is cooperative.        Judgment: Judgment normal.       Results for orders placed or performed in visit on 02/03/24  IBC + Ferritin   Collection Time: 02/03/24  7:39 AM  Result Value Ref Range   Iron 93 42 - 145 ug/dL   Transferrin 161.0 960.4 - 360.0 mg/dL   Saturation Ratios 54.0 20.0 - 50.0 %   Ferritin 52.5 10.0 - 291.0 ng/mL   TIBC 336.0 250.0 - 450.0 mcg/dL  VITAMIN D 25 Hydroxy (Vit-D Deficiency, Fractures)   Collection Time: 02/03/24  7:39 AM  Result Value Ref Range   VITD 33.80 30.00 - 100.00 ng/mL  Vitamin B12   Collection Time: 02/03/24  7:39 AM  Result Value Ref Range   Vitamin B-12 259 211 - 911 pg/mL  CBC with Differential/Platelet   Collection Time: 02/03/24  7:39 AM  Result Value Ref Range   WBC 7.3 4.0 - 10.5 K/uL   RBC 4.56 3.87 - 5.11 Mil/uL   Hemoglobin 13.6 12.0 - 15.0 g/dL   HCT 98.1 19.1 - 47.8 %   MCV 89.6 78.0 - 100.0 fl   MCHC 33.4 30.0 - 36.0 g/dL   RDW 29.5 62.1 - 30.8 %   Platelets 250.0 150.0 - 400.0 K/uL   Neutrophils Relative % 51.9 43.0 - 77.0 %   Lymphocytes Relative 39.6 12.0 - 46.0 %   Monocytes Relative 6.2 3.0 - 12.0 %   Eosinophils Relative 1.9 0.0 - 5.0 %   Basophils Relative 0.4 0.0 - 3.0 %   Neutro Abs 3.8 1.4 - 7.7 K/uL   Lymphs Abs 2.9 0.7 - 4.0 K/uL   Monocytes Absolute 0.5 0.1 - 1.0 K/uL   Eosinophils Absolute 0.1 0.0 - 0.7 K/uL   Basophils Absolute 0.0 0.0 - 0.1 K/uL  Lipid panel   Collection  Time: 02/03/24  7:39 AM  Result Value Ref Range   Cholesterol 229 (H) 0 - 200 mg/dL   Triglycerides 657.8 0.0 - 149.0 mg/dL   HDL 46.96 >29.52 mg/dL   VLDL 84.1 0.0 - 32.4 mg/dL   LDL Cholesterol 401 (H) 0 - 99 mg/dL   Total CHOL/HDL Ratio 5    NonHDL 184.18   Comprehensive metabolic panel   Collection Time: 02/03/24  7:39 AM  Result Value Ref Range   Sodium 141 135 - 145 mEq/L   Potassium 4.6 3.5 - 5.1 mEq/L   Chloride 106 96 - 112 mEq/L   CO2 28 19 - 32 mEq/L   Glucose, Bld 103 (H) 70 - 99 mg/dL   BUN 18 6 - 23 mg/dL   Creatinine, Ser 0.27 0.40 - 1.20 mg/dL   Total Bilirubin 0.5 0.2 - 1.2 mg/dL   Alkaline Phosphatase 63 39 - 117 U/L   AST 15 0 - 37 U/L   ALT 16 0 - 35 U/L   Total Protein 6.9 6.0 - 8.3 g/dL   Albumin 4.5 3.5 - 5.2 g/dL   GFR 25.36 (L) >64.40 mL/min   Calcium 9.9 8.4 - 10.5 mg/dL    This visit occurred during the SARS-CoV-2 public health emergency.  Safety protocols were in place, including screening questions prior to the visit, additional usage of staff PPE, and extensive cleaning  of exam room while observing appropriate contact time as indicated for disinfecting solutions.   COVID 19 screen:  No recent travel or known exposure to COVID19 The patient denies respiratory symptoms of COVID 19 at this time. The importance of social distancing was discussed today.   Assessment and Plan   The patient's preventative maintenance and recommended screening tests for an annual wellness exam were reviewed in full today. Brought up to date unless services declined.  Counselled on the importance of diet, exercise, and its role in overall health and mortality. The patient's FH and SH was reviewed, including their home life, tobacco status, and drug and alcohol status.   Vaccines: Due for COVID booster, refuses flu and update Td. Consider shingrix  Mammo:  summer  2024 per Dr. Vincente Poli.. due  PAP/DVE:  per Dr. Vincente Poli  nonsmoker  No ETOH.  HIV: refused Colonscopy:   2024 repeat in 7 years. Dr. Nicole Kindred  Problem List Items Addressed This Visit     Anemia, iron deficiency   Chronic, iron in normal range and no anemia.      B12 deficiency   Stable, chronic.  Continue current medication.         High cholesterol   Chronic, worsening control in the last year 10-year CVD risk 2.6%  She will continue to work on low-cholesterol diet and increase exercise.       Primary hypertension   Chronic, good control    Bystolic 2.5 mg  daily for  elevated BP, history of tachycardia and possible anxiety component.         Vitamin D deficiency   Stable, chronic.  Continue current medication.        Other Visit Diagnoses       Routine general medical examination at a health care facility    -  Primary       Kerby Nora, MD

## 2024-02-10 NOTE — Assessment & Plan Note (Signed)
Stable, chronic.  Continue current medication.    

## 2024-02-10 NOTE — Assessment & Plan Note (Signed)
 Chronic, worsening control in the last year 10-year CVD risk 2.6%  She will continue to work on low-cholesterol diet and increase exercise.

## 2024-02-10 NOTE — Assessment & Plan Note (Signed)
 Chronic, iron in normal range and no anemia.

## 2024-02-16 DIAGNOSIS — C44712 Basal cell carcinoma of skin of right lower limb, including hip: Secondary | ICD-10-CM | POA: Diagnosis not present

## 2024-02-26 DIAGNOSIS — H5203 Hypermetropia, bilateral: Secondary | ICD-10-CM | POA: Diagnosis not present

## 2024-02-26 DIAGNOSIS — H52223 Regular astigmatism, bilateral: Secondary | ICD-10-CM | POA: Diagnosis not present

## 2024-02-26 DIAGNOSIS — H33322 Round hole, left eye: Secondary | ICD-10-CM | POA: Diagnosis not present

## 2024-02-26 DIAGNOSIS — H524 Presbyopia: Secondary | ICD-10-CM | POA: Diagnosis not present

## 2024-03-15 ENCOUNTER — Other Ambulatory Visit: Payer: Self-pay | Admitting: Family Medicine

## 2024-03-30 DIAGNOSIS — H9313 Tinnitus, bilateral: Secondary | ICD-10-CM | POA: Diagnosis not present

## 2024-04-14 ENCOUNTER — Encounter: Payer: Self-pay | Admitting: Allergy

## 2024-04-14 ENCOUNTER — Ambulatory Visit: Payer: Self-pay | Admitting: Allergy

## 2024-04-14 ENCOUNTER — Other Ambulatory Visit: Payer: Self-pay

## 2024-04-14 VITALS — BP 130/84 | HR 81 | Temp 98.2°F | Resp 18 | Ht 64.0 in | Wt 159.7 lb

## 2024-04-14 DIAGNOSIS — L710 Perioral dermatitis: Secondary | ICD-10-CM

## 2024-04-14 DIAGNOSIS — T50905D Adverse effect of unspecified drugs, medicaments and biological substances, subsequent encounter: Secondary | ICD-10-CM

## 2024-04-14 DIAGNOSIS — Z888 Allergy status to other drugs, medicaments and biological substances status: Secondary | ICD-10-CM | POA: Diagnosis not present

## 2024-04-14 NOTE — Progress Notes (Unsigned)
 New Patient Note  RE: Rachel Bird MRN: [id number] DOB: 1966-08-11 Date of Office Visit: 04/14/2024  Primary care provider: Judithann Novas, MD  Chief Complaint: allergies  History of present illness: Rachel Bird is a 58 y.o. female presenting today for evaluation of possible allergies. Discussed the use of AI scribe software for clinical note transcription with the patient, who gave verbal consent to proceed.  She developed a lip rash last fall, characterized by a darker red, scaly, and itchy area around her lips, described as having a 'sandpaper feeling' and 'like a sunburn'. Initial treatment with a cream prescribed by a dermatologist was ineffective. Subsequent treatments included clindamycin, which caused an allergic reaction, and a combination of tacrolimus and erythromycin creams, which temporarily resolved the rash. However, the rash returned after stopping the creams.  No oral ulcers are present, but there is slight inflammation on the inner part of her lips and gums, although her dentist found no issues. No tongue involvement, swallowing or breathing difficulties, nausea, vomiting, or diarrhea. She has not experienced similar symptoms prior to last year and has not used inhalers.   She suspects a dietary trigger, noting an increased consumption of salmon, up to four or five times a week, around the time the rash began. She eliminated salmon, eggs, and dairy from her diet while continuing the cream treatment. Upon reintroducing eggs and dairy, the rash did not return, leading her to suspect salmon as the trigger. She reports doing a test where she did reintroduce salmon after a period of elimination which resulted in a 'bright red little blood vessel' appearing area on her lower middle lip.  Thus she felt like this helped to support salmon was a likely culprit.  She has not eaten since this morning reintroduction.  He also has not used any ointments or creams over the past 2 weeks  and has not had any return of the rash.  No new facial products or changes in toothpaste, although she switched to a natural variety of toothpaste during the course of her symptoms.  She also experienced a red bumpy rash on her legs and back throughout the winter, initially attributed to fever from COVID-19 and subsequent sinus infections. However, she now suspects it may be related to her diet.   Review of systems: 10pt ROS negative unless noted in HPI  Past medical history: Past Medical History:  Diagnosis Date   Anemia    hx of   Atypical nevus 06/24/2005   left thigh anterior   BCC (basal cell carcinoma) superficial 06/24/2005   right shin medial   BCC (basal cell carcinoma) superficial 01/02/2007   right top sholder   BCC (basal cell carcinoma) superficial 01/25/2008   left scapula   GERD (gastroesophageal reflux disease)    hx of   Hiatal hernia 2014   History of chicken pox    Hyperlipidemia    diet controlled   Hypertension    on meds   mild 09/16/2012   right cheek   moderate-severe 09/16/2012   mid abdomen   MVP (mitral valve prolapse)    Osteopenia    Seasonal allergies    severe 09/16/2012   upper paraspinal   slight 06/24/2005   left outer ankle   slight 09/16/2012   mid chest    Past surgical history: Past Surgical History:  Procedure Laterality Date   CERVICAL ABLATION  2020   COLONOSCOPY  2014   in [place]-Dr. Elliott-along with EGD --  HH   UPPER GI ENDOSCOPY     WISDOM TOOTH EXTRACTION      Family history:  Family History  Problem Relation Age of Onset   Hyperlipidemia Mother    Osteoporosis Mother    Arthritis Mother    Hypertension Mother    Colon polyps Father 75   Heart disease Father 76       CABG   Hyperlipidemia Father    Asthma Sister    Hyperthyroidism Brother    Cancer Maternal Aunt        colon cancer and female cancer   Heart disease Paternal Uncle        CABG   Heart disease Paternal Uncle        stent    Heart disease Paternal Uncle        stent   Cancer Maternal Grandmother        colon and female cancer?   Allergic rhinitis Daughter    Eczema Daughter    Anxiety disorder Daughter    GER disease Daughter    Migraines Daughter    Colon cancer Neg Hx    Esophageal cancer Neg Hx    Stomach cancer Neg Hx    Rectal cancer Neg Hx     Social history: Lives in a home without carpeting with heat pump heating and cooling.  No pets in the home.  There is no concern for water damage, mildew or roaches in the home.  She is a Psychologist, sport and exercise.  Denies smoking history.   Medication List: Current Outpatient Medications  Medication Sig Dispense Refill   erythromycin with ethanol (EMGEL) 2 % gel Apply topically 2 (two) times daily.     nebivolol  (BYSTOLIC ) 2.5 MG tablet TAKE 1 TABLET BY MOUTH EVERY DAY 30 tablet 11   tacrolimus (PROTOPIC) 0.03 % ointment Apply 1 application  topically 2 (two) times daily.     No current facility-administered medications for this visit.    Known medication allergies: Allergies  Allergen Reactions   Clindamycin Rash     Physical examination: Blood pressure 130/84, pulse 81, temperature 98.2 F (36.8 C), temperature source Temporal, resp. rate 18, height 5\' 4"  (1.626 m), weight 159 lb 11.2 oz (72.4 kg), last menstrual period 12/07/2015, SpO2 96%.  General: Alert, interactive, in no acute distress. HEENT: PERRLA, TMs pearly gray, turbinates non-edematous without discharge, post-pharynx non erythematous. Neck: Supple without lymphadenopathy. Lungs: Clear to auscultation without wheezing, rhonchi or rales. {no increased work of breathing. CV: Normal S1, S2 without murmurs. Abdomen: Nondistended, nontender. Skin: Warm and dry, without lesions or rashes. Extremities:  No clubbing, cyanosis or edema. Neuro:   Grossly intact.  Diagnositics/Labs: None today  Assessment and plan:   Perioral Dermatitis Chronic perioral dermatitis with possible dietary  correlation. Differential includes IgE-mediated food allergy, environmental allergy, and contact allergy. Discussed potential salmon allergy and contact allergens. Explained skin and patch testing procedures. - Will plan for skin testing to salmon and other fin fish.  Hold antihistamines for 3 days prior to this skin testing visit. - Will plan to do environmental allergy skin testing as well - Consider patch testing for contact allergens if skin testing is negative.  Patch testing is the test of choice to evaluate for contact dermatitis.  Patches are best placed on a Monday with return to office on Wednesday and Friday of same week for readings.  Once patches are in place to do not get them wet.  You can take antihistamines while patches are  in place.   Allergy to Clindamycin Documented clindamycin allergy with rash. Avoidance necessary.  Schedule skin testing visit (environment 1-55 + select foods see sheet in folder)   I appreciate the opportunity to take part in Yasmine's care. Please do not hesitate to contact me with questions.  Sincerely,   Catha Clink, MD Allergy/Immunology [facility]

## 2024-04-14 NOTE — Patient Instructions (Signed)
 Perioral Dermatitis Chronic perioral dermatitis with possible dietary correlation. Differential includes IgE-mediated food allergy, environmental allergy, and contact allergy. Discussed potential salmon allergy and contact allergens. Explained skin and patch testing procedures. - Will plan for skin testing to salmon and other fin fish.  Hold antihistamines for 3 days prior to this skin testing visit. - Will plan to do environmental allergy skin testing as well - Consider patch testing for contact allergens if skin testing is negative.  Patch testing is the test of choice to evaluate for contact dermatitis.  Patches are best placed on a Monday with return to office on Wednesday and Friday of same week for readings.  Once patches are in place to do not get them wet.  You can take antihistamines while patches are in place.   Allergy to Clindamycin Documented clindamycin allergy with rash. Avoidance necessary.  Schedule skin testing visit

## 2024-04-23 ENCOUNTER — Ambulatory Visit: Admitting: Allergy

## 2024-04-23 ENCOUNTER — Encounter: Payer: Self-pay | Admitting: Allergy

## 2024-04-23 DIAGNOSIS — L710 Perioral dermatitis: Secondary | ICD-10-CM

## 2024-04-23 DIAGNOSIS — T781XXD Other adverse food reactions, not elsewhere classified, subsequent encounter: Secondary | ICD-10-CM

## 2024-04-23 NOTE — Progress Notes (Signed)
 Follow-up Note  RE: Rachel Bird MRN: [id number] DOB: [birth year] Date of Office Visit: 04/23/2024   History of present illness: Rachel Bird is a 58 y.o. female presenting today for skin testing visit.  She was last seen in the office on 03/24/24 for rhinoconjunctivitis.  She is in her usual state of health today without recent illness.  She has held antihistamines for at least 3 days for testing today.   Medication List: Current Outpatient Medications  Medication Sig Dispense Refill   erythromycin with ethanol (EMGEL) 2 % gel Apply topically 2 (two) times daily.     nebivolol  (BYSTOLIC ) 2.5 MG tablet TAKE 1 TABLET BY MOUTH EVERY DAY 30 tablet 11   tacrolimus (PROTOPIC) 0.03 % ointment Apply 1 application  topically 2 (two) times daily.     No current facility-administered medications for this visit.     Known medication allergies: Allergies  Allergen Reactions   Clindamycin Rash     Diagnositics/Labs:  Allergy testing:   Airborne Adult Perc - 04/23/24 0839     Time Antigen Placed 9147    Allergen Manufacturer Floyd Hutchinson    Location Back    Number of Test 55    Panel 1 Select    1. Control-Buffer 50% Glycerol Negative    2. Control-Histamine 2+    3. Bahia Negative    4. French Southern Territories Negative    5. Johnson Negative    6. [place]  Blue Negative    7. Meadow Fescue Negative    8. Perennial Rye Negative    9. Timothy Negative    10. Ragweed Mix Negative    11. Cocklebur Negative    12. Plantain,  English Negative    13. Baccharis Negative    14. Dog Fennel Negative    15. Russian Thistle Negative    16. Lamb's Quarters Negative    17. Sheep Sorrell Negative    18. Rough Pigweed Negative    19. Marsh Elder, Rough Negative    20. Mugwort, Common Negative    21. Box, Elder Negative    22. Cedar, red Negative    23. Sweet Gum Negative    24. Pecan Pollen Negative    25. Pine Mix Negative    26. Walnut, Black Pollen Negative    27. Red Mulberry Negative    28.  Ash Mix Negative    29. Birch Mix Negative    30. Beech American Negative    31. Cottonwood, Guinea-Bissau Negative    32. Hickory, White Negative    33. Maple Mix Negative    34. Oak, [place] Mix Negative    35. Sycamore Eastern Negative    36. Alternaria Alternata Negative    37. Cladosporium Herbarum Negative    38. Aspergillus Mix Negative    39. Penicillium Mix Negative    40. Bipolaris Sorokiniana (Helminthosporium) Negative    41. Drechslera Spicifera (Curvularia) Negative    42. Mucor Plumbeus Negative    43. Fusarium Moniliforme Negative    44. Aureobasidium Pullulans (pullulara) Negative    45. Rhizopus Oryzae Negative    46. Botrytis Cinera Negative    47. Epicoccum Nigrum Negative    48. Phoma Betae Negative    49. Dust Mite Mix Negative    50. Cat Hair 10,000 BAU/ml Negative    51.  Dog Epithelia Negative    52. Mixed Feathers Negative    53. Horse Epithelia Negative    54. Cockroach, German Negative    55.  Tobacco Leaf Negative             Intradermal - 04/23/24 0910     Time Antigen Placed 2956    Allergen Manufacturer Floyd Hutchinson    Location Arm    Number of Test 16    Control Negative    Bahia Negative    French Southern Territories Negative    Johnson Negative    7 Grass Negative    Ragweed Mix Negative    Weed Mix Negative    Tree Mix Negative    Mold 1 Negative    Mold 2 Negative    Mold 3 Negative    Mold 4 Negative    Mite Mix Negative    Cat Negative    Dog Negative    Cockroach Negative             Food Adult Perc - 04/23/24 0800     Time Antigen Placed 2130    Allergen Manufacturer Floyd Hutchinson    Location Back    Number of allergen test 11    14. Pecan Food Negative    18. Trout Negative    19. Tuna Negative    20. Salmon Negative    21. Flounder Negative    22. Codfish Negative    34. Chicken Meat Negative    40. Sweet Potato Negative    56. Lemon Negative    60. Strawberry Negative    61. Blueberry Negative             Allergy testing results  were read and interpreted by provider, documented by clinical staff.   Assessment and plan: Perioral Dermatitis Chronic perioral dermatitis with possible dietary correlation. Differential includes IgE-mediated food allergy, environmental allergy, and contact allergy. Discussed potential salmon allergy and contact allergens. Explained skin and patch testing procedures.  - Environmental allergy testing is negative - Food allergy testing including salmon is negative Thus not concerned for IgE mediated allergy or anaphylaxis  - Recommend patch testing for contact allergens.  Patch testing is the test of choice to evaluate for contact dermatitis.  Patches are best placed on a Monday with return to office on Wednesday and Friday of same week for readings.  Once patches are in place to do not get them wet.  You can take antihistamines while patches are in place.  Will call you once we are scheduling for our new patch testing.   - Continue to monitor foods/triggers of lip dermatitis  Allergy to Clindamycin Documented clindamycin allergy with rash. Avoidance necessary.   Follow-up for patch testing  I appreciate the opportunity to take part in Rachel Bird's care. Please do not hesitate to contact me with questions.  Sincerely,   Catha Clink, MD Allergy/Immunology [facility]

## 2024-04-23 NOTE — Patient Instructions (Addendum)
 Perioral Dermatitis Chronic perioral dermatitis with possible dietary correlation. Differential includes IgE-mediated food allergy, environmental allergy, and contact allergy. Discussed potential salmon allergy and contact allergens. Explained skin and patch testing procedures.  - Environmental allergy testing is negative - Food allergy testing including salmon is negative Thus not concerned for IgE mediated allergy or anaphylaxis  - Recommend patch testing for contact allergens.  Patch testing is the test of choice to evaluate for contact dermatitis.  Patches are best placed on a Monday with return to office on Wednesday and Friday of same week for readings.  Once patches are in place to do not get them wet.  You can take antihistamines while patches are in place.  Will call you once we are scheduling for our new patch testing.   - Continue to monitor foods/triggers of lip dermatitis  Allergy to Clindamycin Documented clindamycin allergy with rash. Avoidance necessary.   Follow-up for patch testing

## 2024-07-22 DIAGNOSIS — Z1231 Encounter for screening mammogram for malignant neoplasm of breast: Secondary | ICD-10-CM | POA: Diagnosis not present

## 2024-07-22 DIAGNOSIS — Z13820 Encounter for screening for osteoporosis: Secondary | ICD-10-CM | POA: Diagnosis not present

## 2024-08-12 DIAGNOSIS — I10 Essential (primary) hypertension: Secondary | ICD-10-CM | POA: Diagnosis not present

## 2024-08-12 DIAGNOSIS — R419 Unspecified symptoms and signs involving cognitive functions and awareness: Secondary | ICD-10-CM | POA: Diagnosis not present

## 2024-08-12 DIAGNOSIS — R635 Abnormal weight gain: Secondary | ICD-10-CM | POA: Diagnosis not present

## 2024-08-12 DIAGNOSIS — M8580 Other specified disorders of bone density and structure, unspecified site: Secondary | ICD-10-CM | POA: Diagnosis not present

## 2024-08-12 DIAGNOSIS — N951 Menopausal and female climacteric states: Secondary | ICD-10-CM | POA: Diagnosis not present

## 2024-09-02 DIAGNOSIS — L570 Actinic keratosis: Secondary | ICD-10-CM | POA: Diagnosis not present

## 2024-09-02 DIAGNOSIS — L858 Other specified epidermal thickening: Secondary | ICD-10-CM | POA: Diagnosis not present

## 2024-09-16 DIAGNOSIS — N951 Menopausal and female climacteric states: Secondary | ICD-10-CM | POA: Diagnosis not present

## 2024-09-16 DIAGNOSIS — R419 Unspecified symptoms and signs involving cognitive functions and awareness: Secondary | ICD-10-CM | POA: Diagnosis not present

## 2024-09-16 DIAGNOSIS — R635 Abnormal weight gain: Secondary | ICD-10-CM | POA: Diagnosis not present

## 2024-09-20 ENCOUNTER — Encounter: Payer: Self-pay | Admitting: Family Medicine

## 2024-09-20 ENCOUNTER — Ambulatory Visit: Admitting: Family Medicine

## 2024-09-20 DIAGNOSIS — L235 Allergic contact dermatitis due to other chemical products: Secondary | ICD-10-CM

## 2024-09-20 NOTE — Patient Instructions (Signed)
 Diagnostics: NAC 80 patches placed NAC-80 (1-80)   1. Ammonium persulfate  2. [place] Balsam  3. Omitted  4. 4-tert-Butylphenolformaldehyde resin (PTBP)  5. Bacitracin  6. Budesonide  7. Quaternium-15  8. Cinnamal  9. Cobalt(II) chloride hexahydrate  10. Colophonium  11. Methyldibromo glutaronitrile  12. Decyl Glucoside  13. Ethylenediamine dihydrochloride  14. 2-Hydroxyethyl methacrylate  15. Hydroperoxides of Linalool  16. Iodopropynyl butylcarbamate  17. 2-Mercaptobenzothiazole (MBT)  18. Thiuram mix  19. METHYLISOTHIAZOLINONE  20. Propylene glycol  21. 1,3-Diphenylguanidine  22. Hydroperoxides of Limonene  23. Black rubber mix  24. Carba mix  25. Fragrance mix I  26. Fragrance mix II  27. Textile dye mix II  28. Neomycin sulfate  29. Nickel(II) sulfate hexahydrate  30. p-Phenylenediamine (PPD)  31. Potassium dichromate  32. Propolis  33. Sodium Metabisulfite  34. Tixocortol-21-pivalate  35. Lanolin alcohol  36. Methylisothiazolinone + Methylchloroisothiazolinone  37. Cocamidopropyl betaine  38. 3-(Dimethylamino)-1-propylamine  39. Formaldehyde  40. Oleamidopropyl dimethylamine  41. 2-Bromo-2-Nitropropane-l,3-diol  42. Diazolidinyl urea  43. DMDM Hydantoin  44. Epoxy resin, Bisphenol A  45. Benzophenone-4  46. Imidazolidinyl urea  47. Lauryl polyglucose  48 Methyl methacrylate  49. Paraben mix  50. Mercapto mix  51. Caine mix III  52. Mixed dialkyl thiourea  53. Compositae mix II  54. Toluenesulfonamide formaldehyde resin  55. Tea Tree Oil oxidized  56. Ylang-Ylang oil  57. Amidoamine  58. Amerchol L 101  59. Benzocaine  60. Benzyl alchohol  61. Benzyl salicylate  62. Chloroxylenol (PCMX)  63. Cocamide DEA  64. Clobetasol-17-propionate  65. Toluene-2,5-Diamine sulfate  66. Ethyl acrylate  67. N-Isopropyl-N-phenyl--4-phenylenediamine (IPPD)  68. Lidocaine   69. Omitted  70. Sesquiterpene lactone mix  71. 2-n-Octyl-4-isothiazolin-3-one  72. Propyl  gallate  73. Polymyxin B sulfate  74. Pramoxine hydrochloride  75. Sodium benzoate  76. Sorbitan oleate  77. Sorbitan sesquioleate  78. Tocopherol  79. BENZALKONIUM CHLORIDE  80. Chlorhexidine digluconate    Allergic contact dermatitis - Instructions provided on care of the patches for the next 48 hours. GLENWOOD Kirsch was instructed to avoid showering for the next 48 hours. GLENWOOD Kirsch will follow up in 48 hours and 96 hours for patch readings.    Call the clinic if this treatment plan is not working well for you  Follow up in 2 days or sooner if needed.

## 2024-09-20 NOTE — Progress Notes (Signed)
 Follow-up Note  RE: Rachel Bird MRN: [id number] DOB: 04/24/1966 Date of Office Visit: 09/20/2024  Primary care provider: Avelina Greig BRAVO, MD Referring provider: Avelina Greig BRAVO, MD   Rachel Bird returns to the office today for the patch test placement, given suspected history of contact dermatitis. Patches placed and care of patches discussed, specifically the need to keep patches dry.    Diagnostics: NAC 80 patches placed NAC-80 (1-80)   1. Ammonium persulfate  2. [place] Balsam  3. Omitted  4. 4-tert-Butylphenolformaldehyde resin (PTBP)  5. Bacitracin  6. Budesonide  7. Quaternium-15  8. Cinnamal  9. Cobalt(II) chloride hexahydrate  10. Colophonium  11. Methyldibromo glutaronitrile  12. Decyl Glucoside  13. Ethylenediamine dihydrochloride  14. 2-Hydroxyethyl methacrylate  15. Hydroperoxides of Linalool  16. Iodopropynyl butylcarbamate  17. 2-Mercaptobenzothiazole (MBT)  18. Thiuram mix  19. METHYLISOTHIAZOLINONE  20. Propylene glycol  21. 1,3-Diphenylguanidine  22. Hydroperoxides of Limonene  23. Black rubber mix  24. Carba mix  25. Fragrance mix I  26. Fragrance mix II  27. Textile dye mix II  28. Neomycin sulfate  29. Nickel(II) sulfate hexahydrate  30. p-Phenylenediamine (PPD)  31. Potassium dichromate  32. Propolis  33. Sodium Metabisulfite  34. Tixocortol-21-pivalate  35. Lanolin alcohol  36. Methylisothiazolinone + Methylchloroisothiazolinone  37. Cocamidopropyl betaine  38. 3-(Dimethylamino)-1-propylamine  39. Formaldehyde  40. Oleamidopropyl dimethylamine  41. 2-Bromo-2-Nitropropane-l,3-diol  42. Diazolidinyl urea  43. DMDM Hydantoin  44. Epoxy resin, Bisphenol A  45. Benzophenone-4  46. Imidazolidinyl urea  47. Lauryl polyglucose  48 Methyl methacrylate  49. Paraben mix  50. Mercapto mix  51. Caine mix III  52. Mixed dialkyl thiourea  53. Compositae mix II  54. Toluenesulfonamide formaldehyde resin  55. Tea Tree Oil oxidized  56. Ylang-Ylang  oil  57. Amidoamine  58. Amerchol L 101  59. Benzocaine  60. Benzyl alchohol  61. Benzyl salicylate  62. Chloroxylenol (PCMX)  63. Cocamide DEA  64. Clobetasol-17-propionate  65. Toluene-2,5-Diamine sulfate  66. Ethyl acrylate  67. N-Isopropyl-N-phenyl--4-phenylenediamine (IPPD)  68. Lidocaine   69. Omitted  70. Sesquiterpene lactone mix  71. 2-n-Octyl-4-isothiazolin-3-one  72. Propyl gallate  73. Polymyxin B sulfate  74. Pramoxine hydrochloride  75. Sodium benzoate  76. Sorbitan oleate  77. Sorbitan sesquioleate  78. Tocopherol  79. BENZALKONIUM CHLORIDE  80. Chlorhexidine digluconate    Allergic contact dermatitis - Instructions provided on care of the patches for the next 48 hours. GLENWOOD Rachel Bird was instructed to avoid showering for the next 48 hours. GLENWOOD Rachel Bird will follow up in 48 hours and 96 hours for patch readings.    Call the clinic if this treatment plan is not working well for you  Follow up in 2 days or sooner if needed.  Thank you for the opportunity to care for this patient.  Please do not hesitate to contact me with questions.  Arlean Mutter, FNP [facility]  [facility]

## 2024-09-20 NOTE — Addendum Note (Signed)
 Addended by: NANCEE JON SAILOR on: 09/20/2024 04:37 PM   Modules accepted: Orders

## 2024-09-22 ENCOUNTER — Encounter: Payer: Self-pay | Admitting: Family

## 2024-09-22 ENCOUNTER — Ambulatory Visit: Admitting: Family

## 2024-09-22 DIAGNOSIS — N951 Menopausal and female climacteric states: Secondary | ICD-10-CM | POA: Diagnosis not present

## 2024-09-22 DIAGNOSIS — Z01419 Encounter for gynecological examination (general) (routine) without abnormal findings: Secondary | ICD-10-CM | POA: Diagnosis not present

## 2024-09-22 DIAGNOSIS — L235 Allergic contact dermatitis due to other chemical products: Secondary | ICD-10-CM

## 2024-09-22 DIAGNOSIS — Z6828 Body mass index (BMI) 28.0-28.9, adult: Secondary | ICD-10-CM | POA: Diagnosis not present

## 2024-09-22 NOTE — Progress Notes (Signed)
 Follow-up Note  RE: Rachel Bird FMW:[id number]   DOB: 11-Oct-1966 Date of Office Visit: 09/22/24  Primary care provider: Avelina Greig BRAVO., MD Referring provider: Avelina Greig BRAVO., MD   [name] returns to the office today for the initial patch test interpretation, given suspected history of contact dermatitis.    Diagnostics:    NAC 80 48-hour reading:  NAC Panel Tested NAC-80 (1-80)   1. Ammonium persulfate Negative   2. [place] Balsam Negative   3. BENZISOTHIAZOLINONE Comment   omit  4. 4-tert-Butylphenolformaldehyde resin (PTBP) Negative   5. Bacitracin Negative   6. Budesonide Negative   7. Quaternium-15 Negative   8. Cinnamal Negative   9. Cobalt(II) chloride hexahydrate Negative  10. Colophonium Negative   11. Methyldibromo glutaronitrile Negative   12. Decyl Glucoside Negative   13. Ethylenediamine dihydrochloride Negative   14. 2-Hydroxyethyl methacrylate Negative   15. Hydroperoxides of Linalool Negative   16. Iodopropynyl butylcarbamate Negative   17. 2-Mercaptobenzothiazole (MBT) Negative   18. Thiuram mix Negative   19. METHYLISOTHIAZOLINONE Negative   20. Propylene glycol Negative   21. 1,3-Diphenylguanidine Negative   22. Hydroperoxides of Limonene Negative   23. Black rubber mix Negative   24. Carba mix Negative   25. Fragrance mix I Negative   26. Fragrance mix II Negative   27. Textile dye mix II Negative   28. Neomycin sulfate Negative   29. Nickel(II) sulfate hexahydrate 1+  30. p-Phenylenediamine (PPD) Negative   31. Potassium dichromate Negative   32. Propolis Negative   33. Sodium Metabisulfite Negative   34. Tixocortol-21-pivalate Negative   35. Lanolin alcohol Negative   36. Methylisothiazolinone + Methylchloroisothiazolinone Negative   37. Cocamidopropyl betaine Negative   38. 3-(Dimethylamino)-1-propylamine Negative   39. Formaldehyde Negative  40. Oleamidopropyl dimethylamine Negative   41. 2-Bromo-2-Nitropropane-l,3-diol Negative   42.  Diazolidinyl urea Negative  43. DMDM Hydantoin Negative   44. Epoxy resin, Bisphenol A Negative   45. Benzophenone-4 Negative   46. Imidazolidinyl urea Negative   47. Lauryl polyglucose Negative  48 Methyl methacrylate Negative   49. Paraben mix Negative   50. Mercapto mix Negative   51. Caine mix III Negative   52. Mixed dialkyl thiourea Negative   53. Compositae mix II Negative   54. Toluenesulfonamide formaldehyde resin Negative   55. Tea Tree Oil oxidized Negative   56. Ylang-Ylang oil Negative   57. Amidoamine Negative   58. Amerchol L 101 Negative   59. Benzocaine Negative   60. Benzyl alchohol Negative   61. Benzyl salicylate Negative   62. Chloroxylenol (PCMX) Negative   63. Cocamide DEA Negative   64. Clobetasol-17-propionate Negative   65. Toluene-2,5-Diamine sulfate Negative   66. Ethyl acrylate Negative   67. N-Isopropyl-N-phenyl--4-phenylenediamine (IPPD) Negative   68. Lidocaine  Negative   69. Hydroxyisohexyl 3-Cyclohexene Carboxaldehyde Comment   omit  70. Sesquiterpene lactone mix Negative   71. 2-n-Octyl-4-isothiazolin-3-one Negative   72. Propyl gallate Negative   73. Polymyxin B sulfate Negative   74. Pramoxine hydrochloride Negative   75. Sodium benzoate Negative   76. Sorbitan oleate Negative   77. Sorbitan sesquioleate Negative   78. Tocopherol Negative   79. BENZALKONIUM CHLORIDE Negative   80. Chlorhexidine digluconate Negative     Plan:   Allergic contact dermatitis - The patient has been provided detailed information regarding the substances she is sensitive to, as well as products containing the substances.   - Meticulous avoidance of these substances is recommended.  - If avoidance is not possible, the use  of barrier creams or lotions is recommended. - If symptoms persist or progress despite meticulous avoidance of substance, a dermatology referral may be warranted.  Thank you for the opportunity to care for this patient.  Please do not  hesitate to contact me with questions.   Wanda Craze, FNP [facility]

## 2024-09-24 ENCOUNTER — Encounter: Payer: Self-pay | Admitting: Allergy

## 2024-09-24 ENCOUNTER — Ambulatory Visit: Admitting: Allergy

## 2024-09-24 DIAGNOSIS — L235 Allergic contact dermatitis due to other chemical products: Secondary | ICD-10-CM

## 2024-09-24 NOTE — Progress Notes (Signed)
 Follow-up Note  RE: Rachel Bird MRN: [id number] DOB: 29-Oct-1966 Date of Office Visit: 09/24/2024  Primary care provider: Avelina Greig BRAVO, MD Referring provider: Avelina Greig BRAVO, MD   [name] returns to the office today for the final patch test interpretation, given suspected history of contact dermatitis.    Diagnostics:  NAC80 96 hour reading:    NAC-80 - 09/24/24 0800     NAC Reading Interval Day 3    NAC Panel Tested NAC-80 (1-80)    1. Ammonium persulfate Negative    2. [place] Balsam Negative    3. BENZISOTHIAZOLINONE Negative    4. 4-tert-Butylphenolformaldehyde resin (PTBP) Negative    5. Bacitracin 2+    6. Budesonide Negative    7. Quaternium-15 Negative    8. Cinnamal Negative    9. Cobalt(II) chloride hexahydrate Negative    10. Colophonium Negative    11. Methyldibromo glutaronitrile Negative    12. Decyl Glucoside Negative    13. Ethylenediamine dihydrochloride Negative    14. 2-Hydroxyethyl methacrylate Negative    15. Hydroperoxides of Linalool Negative    16. Iodopropynyl butylcarbamate Negative    17. 2-Mercaptobenzothiazole (MBT) Negative    18. Thiuram mix Negative    19. METHYLISOTHIAZOLINONE Negative    20. Propylene glycol Negative    21. 1,3-Diphenylguanidine Negative    22. Hydroperoxides of Limonene Negative    23. Black rubber mix Negative    24. Carba mix Negative    25. Fragrance mix I Negative    26. Fragrance mix II Negative    27. Textile dye mix II Negative    28. Neomycin sulfate Negative    29. Nickel(II) sulfate hexahydrate 3+    30. p-Phenylenediamine (PPD) Negative    31. Potassium dichromate Negative    32. Propolis 2+    33. Sodium Metabisulfite Negative    34. Tixocortol-21-pivalate Negative    35. Lanolin alcohol Negative    36. Methylisothiazolinone + Methylchloroisothiazolinone Negative    37. Cocamidopropyl betaine Negative    38. 3-(Dimethylamino)-1-propylamine Negative    39. Formaldehyde Negative    40.  Oleamidopropyl dimethylamine Negative    41. 2-Bromo-2-Nitropropane-l,3-diol Negative    42. Diazolidinyl urea Negative    43. DMDM Hydantoin Negative    45. Benzophenone-4 Negative    46. Imidazolidinyl urea Negative    47. Lauryl polyglucose Negative    48 Methyl methacrylate Negative    49. Paraben mix Negative    50. Mercapto mix Negative    51. Caine mix III Negative    52. Mixed dialkyl thiourea Negative    53. Compositae mix II Negative    54. Toluenesulfonamide formaldehyde resin Negative    55. Tea Tree Oil oxidized Negative    56. Ylang-Ylang oil Negative    57. Amidoamine Negative    58. Amerchol L 101 Negative    59. Benzocaine Negative    60. Benzyl alchohol Negative    61. Benzyl salicylate Negative    62. Chloroxylenol (PCMX) Negative    63. Cocamide DEA Negative    64. Clobetasol-17-propionate Negative    65. Toluene-2,5-Diamine sulfate Negative    66. Ethyl acrylate Negative    67. N-Isopropyl-N-phenyl--4-phenylenediamine (IPPD) Negative    68. Lidocaine  Negative    69. Hydroxyisohexyl 3-Cyclohexene Carboxaldehyde Negative    70. Sesquiterpene lactone mix Negative    71. 2-n-Octyl-4-isothiazolin-3-one Negative    72. Propyl gallate Negative    73. Polymyxin B sulfate Negative    74. Pramoxine hydrochloride Negative  75. Sodium benzoate Negative    76. Sorbitan oleate Negative    77. Sorbitan sesquioleate Negative    78. Tocopherol Negative    79. BENZALKONIUM CHLORIDE Negative    80. Chlorhexidine digluconate Negative           Plan:  Allergic contact dermatitis The patient has been provided detailed information regarding the substances she is sensitive to, as well as products containing the substances.  Meticulous avoidance of these substances is recommended. If avoidance is not possible, the use of barrier creams or lotions is recommended. Product safety list will be emailed to [email or web address]  Danita Brain, MD [facility]

## 2024-09-27 ENCOUNTER — Encounter: Admitting: Family Medicine

## 2025-02-02 ENCOUNTER — Other Ambulatory Visit

## 2025-02-10 ENCOUNTER — Encounter: Admitting: Family Medicine

## 2025-02-15 ENCOUNTER — Encounter: Admitting: Family Medicine
